# Patient Record
Sex: Female | Born: 1990 | Race: White | Hispanic: No | Smoking: Never smoker
Health system: Southern US, Community
[De-identification: ages and names within clinical notes are randomized; demographics above are authoritative.]

## PROBLEM LIST (undated history)

## (undated) DIAGNOSIS — F32A Depression, unspecified: Secondary | ICD-10-CM

## (undated) DIAGNOSIS — J45909 Unspecified asthma, uncomplicated: Secondary | ICD-10-CM

## (undated) DIAGNOSIS — F419 Anxiety disorder, unspecified: Secondary | ICD-10-CM

## (undated) DIAGNOSIS — K589 Irritable bowel syndrome without diarrhea: Secondary | ICD-10-CM

## (undated) HISTORY — DX: Depression, unspecified: F32.A

## (undated) HISTORY — PX: NO PAST SURGERIES: SHX2092

## (undated) HISTORY — DX: Irritable bowel syndrome, unspecified: K58.9

## (undated) HISTORY — DX: Unspecified asthma, uncomplicated: J45.909

## (undated) HISTORY — DX: Anxiety disorder, unspecified: F41.9

---

## 2019-06-19 ENCOUNTER — Other Ambulatory Visit: Payer: Self-pay | Admitting: Gastroenterology

## 2019-06-19 DIAGNOSIS — R1084 Generalized abdominal pain: Secondary | ICD-10-CM

## 2019-06-26 ENCOUNTER — Ambulatory Visit: Admission: RE | Admit: 2019-06-26 | Payer: 59 | Source: Ambulatory Visit

## 2019-07-18 ENCOUNTER — Other Ambulatory Visit: Payer: Self-pay | Admitting: Gastroenterology

## 2019-07-18 DIAGNOSIS — R110 Nausea: Secondary | ICD-10-CM

## 2019-07-23 ENCOUNTER — Ambulatory Visit: Payer: 59

## 2019-07-24 ENCOUNTER — Ambulatory Visit
Admission: RE | Admit: 2019-07-24 | Discharge: 2019-07-24 | Disposition: A | Discharge status: Home / Self Care | Payer: 59 | Source: Ambulatory Visit | Attending: Gastroenterology | Admitting: Gastroenterology

## 2019-07-24 ENCOUNTER — Other Ambulatory Visit: Payer: Self-pay

## 2019-07-24 DIAGNOSIS — R110 Nausea: Secondary | ICD-10-CM | POA: Diagnosis present

## 2020-06-18 ENCOUNTER — Ambulatory Visit
Admission: EM | Admit: 2020-06-18 | Discharge: 2020-06-18 | Disposition: A | Discharge status: Home / Self Care | Payer: Managed Care, Other (non HMO) | Attending: Family Medicine | Admitting: Family Medicine

## 2020-06-18 ENCOUNTER — Other Ambulatory Visit: Payer: Self-pay

## 2020-06-18 ENCOUNTER — Encounter: Payer: Self-pay | Admitting: Emergency Medicine

## 2020-06-18 DIAGNOSIS — L0291 Cutaneous abscess, unspecified: Secondary | ICD-10-CM

## 2020-06-18 MED ORDER — DOXYCYCLINE HYCLATE 100 MG PO CAPS: 100.0000 mg | ORAL_CAPSULE | Freq: Two times a day (BID) | ORAL | 0 refills | Status: DC

## 2020-06-18 MED ORDER — KETOROLAC TROMETHAMINE 10 MG PO TABS: 10.0000 mg | ORAL_TABLET | Freq: Four times a day (QID) | ORAL | 0 refills | Status: DC | PRN

## 2020-06-18 NOTE — ED Provider Notes (Addendum)
[facility]    CSN: [id number] Arrival date & time: 06/18/20  0932  History   Chief Complaint Chief Complaint  Patient presents with  . Abscess    On the tailbone   HPI  30 year old female presents with the above complaint.  Symptoms started 2 saturdays ago.  Patient reports a "cyst" at the level of her sacrum (at the tip of the gluteal cleft).  It is very painful.  It is tender.  Pain 8/10 in severity.  She has been applying warm compresses with some minimal relief.  She has been taking ibuprofen as well with little improvement.  She states that she has had this twice previously and resolved on its own after supportive therapy at home.  No fever.  No other complaints at this time.   Home Medications    Prior to Admission medications   Medication Sig Start Date End Date Taking? Authorizing Provider  doxycycline (VIBRAMYCIN) 100 MG capsule Take 1 capsule (100 mg total) by mouth 2 (two) times daily. 06/18/20  Yes Parveen Freehling G, DO  ketorolac (TORADOL) 10 MG tablet Take 1 tablet (10 mg total) by mouth every 6 (six) hours as needed for moderate pain or severe pain. 06/18/20  Yes Leor Whyte G, DO  norethindrone-ethinyl estradiol (NORTREL 0.5/35, 28,) 0.5-35 MG-MCG tablet Take by mouth. 05/09/19  Yes [provider]  pantoprazole (PROTONIX) 40 MG tablet Take 1 tablet by mouth daily. 05/22/20  Yes [provider]   Social History Social History   Tobacco Use  . Smoking status: Never Smoker  . Smokeless tobacco: Never Used  Substance Use Topics  . Alcohol use: Never  . Drug use: Never   Allergies   Patient has no known allergies.   Review of Systems Review of Systems  Constitutional: Negative.   Skin:       Abscess.    Physical Exam Triage Vital Signs ED Triage Vitals  Enc Vitals Group     BP 06/18/20 1056 112/79     Pulse Rate 06/18/20 1056 93     Resp 06/18/20 1056 17     Temp 06/18/20 1056 98.3 F (36.8 C)     Temp Source 06/18/20 1056  Oral     SpO2 06/18/20 1056 95 %     Weight --      Height --      Head Circumference --      Peak Flow --      Pain Score 06/18/20 1053 8     Pain Loc --      Pain Edu? --      Excl. in GC? --    Updated Vital Signs BP 112/79 (BP Location: Left Arm)   Pulse 93   Temp 98.3 F (36.8 C) (Oral)   Resp 17   LMP 05/12/2020 (Approximate)   SpO2 95%   Visual Acuity Right Eye Distance:   Left Eye Distance:   Bilateral Distance:    Right Eye Near:   Left Eye Near:    Bilateral Near:     Physical Exam Vitals and nursing note reviewed.  Constitutional:      General: She is not in acute distress.    Appearance: Normal appearance. She is not ill-appearing.  HENT:     Head: Normocephalic and atraumatic.  Eyes:     General:        Right eye: No discharge.        Left eye: No discharge.  Conjunctiva/sclera: Conjunctivae normal.  Pulmonary:     Effort: Pulmonary effort is normal. No respiratory distress.  Skin:         Comments: Fluctuant abscess at the labelled location. Surrounding warmth and erythema.  Tender to palpation.  Neurological:     Mental Status: She is alert.  Psychiatric:        Mood and Affect: Mood normal.        Behavior: Behavior normal.    UC Treatments / Results  Labs (all labs ordered are listed, but only abnormal results are displayed) Labs Reviewed - No data to display  EKG   Radiology No results found.  Procedures Incision and Drainage  Date/Time: 06/18/2020 12:43 PM Performed by: Tommie Sams, DO Authorized by: Tommie Sams, DO   Consent:    Consent obtained:  Verbal   Consent given by:  Patient   Risks discussed:  Pain   Alternatives discussed:  Alternative treatment Location:    Type:  Abscess   Location:  Anogenital   Anogenital location:  Gluteal cleft Pre-procedure details:    Skin preparation:  Povidone-iodine Anesthesia:    Anesthesia method:  Local infiltration   Local anesthetic:  Lidocaine 1% WITH  epi Procedure type:    Complexity:  Simple Procedure details:    Incision types:  Stab incision   Drainage:  Purulent   Drainage amount:  Copious   Wound treatment:  Wound left open   Packing materials:  1/4 in iodoform gauze Post-procedure details:    Procedure completion:  Tolerated well, no immediate complications   (including critical care time)  Medications Ordered in UC Medications - No data to display  Initial Impression / Assessment and Plan / UC Course  I have reviewed the triage vital signs and the nursing notes.  Pertinent labs & imaging results that were available during my care of the patient were reviewed by me and considered in my medical decision making (see chart for details).    30 year old female presents with abscess. I & D performed today. See above. Placing on Doxycyline. Packing to be removed in 48 hours (either at home or by [place] here). Toradol for pain.  Final Clinical Impressions(s) / UC Diagnoses   Final diagnoses:  Abscess     Discharge Instructions     Remove packing in 48 hours (or return here for [place] to remove).  Antibiotic as prescribed.  Take care  Dr. Adriana Simas    ED Prescriptions    Medication Sig Dispense Auth. Provider   ketorolac (TORADOL) 10 MG tablet Take 1 tablet (10 mg total) by mouth every 6 (six) hours as needed for moderate pain or severe pain. 20 tablet Jaylenne Hamelin G, DO   doxycycline (VIBRAMYCIN) 100 MG capsule Take 1 capsule (100 mg total) by mouth 2 (two) times daily. 14 capsule Everlene Other G, DO     PDMP not reviewed this encounter.    Tommie Sams, [place] 06/18/20 1244

## 2020-06-18 NOTE — ED Triage Notes (Signed)
Pt states that she has a cyst in her tail that appeared two weeks ago. Pt states that she noticed that the cyst started draining blood and clear/yellow fluid last night. Pt states that she has been taking Ibuprofen to control the pain

## 2020-06-18 NOTE — Discharge Instructions (Signed)
Remove packing in 48 hours (or return here for [place] to remove).  Antibiotic as prescribed.  Take care  Dr. Adriana Simas

## 2020-06-19 ENCOUNTER — Ambulatory Visit: Payer: Self-pay

## 2020-06-20 ENCOUNTER — Other Ambulatory Visit: Payer: Self-pay

## 2020-06-20 ENCOUNTER — Ambulatory Visit
Admission: RE | Admit: 2020-06-20 | Discharge: 2020-06-20 | Disposition: A | Discharge status: Home / Self Care | Payer: Managed Care, Other (non HMO) | Source: Ambulatory Visit | Attending: Family Medicine | Admitting: Family Medicine

## 2020-06-20 VITALS — BP 111/79 | HR 83 | Temp 98.9°F | Resp 18 | Ht 67.0 in | Wt 180.0 lb

## 2020-06-20 DIAGNOSIS — Z5189 Encounter for other specified aftercare: Secondary | ICD-10-CM

## 2020-06-20 NOTE — ED Provider Notes (Signed)
[facility]    CSN: [id number] Arrival date & time: 06/20/20  1052      History   Chief Complaint Chief Complaint  Patient presents with  . Wound Check   HPI  30 year old female presents for wound check/packing removal.  Doing well. Wound still draining some. Much improved. No fever. Compliant with antibiotic.  Home Medications    Prior to Admission medications   Medication Sig Start Date End Date Taking? Authorizing Provider  doxycycline (VIBRAMYCIN) 100 MG capsule Take 1 capsule (100 mg total) by mouth 2 (two) times daily. 06/18/20  Yes Zooey Schreurs G, DO  ketorolac (TORADOL) 10 MG tablet Take 1 tablet (10 mg total) by mouth every 6 (six) hours as needed for moderate pain or severe pain. 06/18/20  Yes Page Pucciarelli G, DO  norethindrone-ethinyl estradiol (NORTREL 0.5/35, 28,) 0.5-35 MG-MCG tablet Take by mouth. 05/09/19  Yes [provider]  pantoprazole (PROTONIX) 40 MG tablet Take 1 tablet by mouth daily. 05/22/20  Yes [provider]   Social History Social History   Tobacco Use  . Smoking status: Never Smoker  . Smokeless tobacco: Never Used  Substance Use Topics  . Alcohol use: Never  . Drug use: Never     Allergies   Patient has no known allergies.   Review of Systems Review of Systems  Constitutional: Negative for fever.  Skin: Positive for wound.   Physical Exam Triage Vital Signs ED Triage Vitals  Enc Vitals Group     BP 06/20/20 1119 111/79     Pulse Rate 06/20/20 1119 83     Resp 06/20/20 1119 18     Temp 06/20/20 1119 98.9 F (37.2 C)     Temp Source 06/20/20 1119 Oral     SpO2 06/20/20 1119 97 %     Weight 06/20/20 1117 180 lb (81.6 kg)     Height 06/20/20 1117 5\' 7"  (1.702 m)     Head Circumference --      Peak Flow --      Pain Score 06/20/20 1117 0     Pain Loc --      Pain Edu? --      Excl. in GC? --    Updated Vital Signs BP 111/79 (BP Location: Right Arm)   Pulse 83   Temp 98.9 F (37.2 C) (Oral)    Resp 18   Ht 5\' 7"  (1.702 m)   Wt 81.6 kg   LMP 05/13/2020   SpO2 97%   BMI 28.19 kg/m   Visual Acuity Right Eye Distance:   Left Eye Distance:   Bilateral Distance:    Right Eye Near:   Left Eye Near:    Bilateral Near:     Physical Exam Vitals and nursing note reviewed.  Constitutional:      General: She is not in acute distress.    Appearance: Normal appearance. She is not ill-appearing.  Skin:    Comments: Packing removed.  Minimal purulent drainage.  Erythema and induration improved.  Neurological:     Mental Status: She is alert.    UC Treatments / Results  Labs (all labs ordered are listed, but only abnormal results are displayed) Labs Reviewed - No data to display  EKG   Radiology No results found.  Procedures Procedures (including critical care time)  Medications Ordered in UC Medications - No data to display  Initial Impression / Assessment and Plan / UC Course  I have reviewed the triage  vital signs and the nursing notes.  Pertinent labs & imaging results that were available during my care of the patient were reviewed by me and considered in my medical decision making (see chart for details).    30 year old female presents for recheck and packing removal.  Packing removed today.  Wound dressed. Doing well at this time. Finish antibiotic.   Final Clinical Impressions(s) / UC Diagnoses   Final diagnoses:  Wound check, abscess   Discharge Instructions   None    ED Prescriptions    None     PDMP not reviewed this encounter.   Tommie Sams, DO 06/20/20 1200

## 2020-06-20 NOTE — ED Triage Notes (Signed)
Patient here for follow up to her abscess that was drained 2 days ago.

## 2020-11-18 ENCOUNTER — Ambulatory Visit: Payer: Managed Care, Other (non HMO) | Admitting: Family Medicine

## 2020-11-24 ENCOUNTER — Ambulatory Visit: Payer: Managed Care, Other (non HMO) | Admitting: Family Medicine

## 2020-11-24 ENCOUNTER — Other Ambulatory Visit: Payer: Self-pay

## 2020-11-24 ENCOUNTER — Encounter: Payer: Self-pay | Admitting: Family Medicine

## 2020-11-24 VITALS — BP 120/80 | HR 68 | Ht 67.0 in | Wt 193.0 lb

## 2020-11-24 DIAGNOSIS — Z7689 Persons encountering health services in other specified circumstances: Secondary | ICD-10-CM | POA: Diagnosis not present

## 2020-11-24 DIAGNOSIS — K2100 Gastro-esophageal reflux disease with esophagitis, without bleeding: Secondary | ICD-10-CM

## 2020-11-24 NOTE — Patient Instructions (Signed)
Managing Anxiety, Adult After being diagnosed with an anxiety disorder, you may be relieved to know why you have felt or behaved a certain way. You may also feel overwhelmed about the treatment ahead and what it will mean for your life. With care and support, youcan manage this condition and recover from it. How to manage lifestyle changes Managing stress and anxiety  Stress is your body's reaction to life changes and events, both good and bad. Most stress will last just a few hours, but stress can be ongoing and can lead to more than just stress. Although stress can play a major role in anxiety, it is not the same as anxiety. Stress is usually caused by something external, such as a deadline, test, or competition. Stress normally passes after thetriggering event has ended.  Anxiety is caused by something internal, such as imagining a terrible outcome or worrying that something will go wrong that will devastate you. Anxiety often does not go away even after the triggering event is over, and it can become long-term (chronic) worry. It is important to understand the differences between stress and anxiety and to manage your stress effectively so that it does not lead to ananxious response. Talk with your health care provider or a counselor to learn more about reducing anxiety and stress. He or she may suggest tension reduction techniques, such as: Music therapy. This can include creating or listening to music that you enjoy and that inspires you. Mindfulness-based meditation. This involves being aware of your normal breaths while not trying to control your breathing. It can be done while sitting or walking. Centering prayer. This involves focusing on a word, phrase, or sacred image that means something to you and brings you peace. Deep breathing. To do this, expand your stomach and inhale slowly through your nose. Hold your breath for 3-5 seconds. Then exhale slowly, letting your stomach muscles  relax. Self-talk. This involves identifying thought patterns that lead to anxiety reactions and changing those patterns. Muscle relaxation. This involves tensing muscles and then relaxing them. Choose a tension reduction technique that suits your lifestyle and personality. These techniques take time and practice. Set aside 5-15 minutes a day to do them. Therapists can offer counseling and training in these techniques. The training to help with anxiety may be covered by some insurance plans. Other things you can do to manage stress and anxiety include: Keeping a stress/anxiety diary. This can help you learn what triggers your reaction and then learn ways to manage your response. Thinking about how you react to certain situations. You may not be able to control everything, but you can control your response. Making time for activities that help you relax and not feeling guilty about spending your time in this way. Visual imagery and yoga can help you stay calm and relax.  Medicines Medicines can help ease symptoms. Medicines for anxiety include: Anti-anxiety drugs. Antidepressants. Medicines are often used as a primary treatment for anxiety disorder. Medicines will be prescribed by a health care provider. When used together, medicines, psychotherapy, and tension reduction techniques may be the most effectivetreatment. Relationships Relationships can play a big part in helping you recover. Try to spend more time connecting with trusted friends and family members. Consider going to couples counseling, taking family education classes, or going to familytherapy. Therapy can help you and others better understand your condition. How to recognize changes in your anxiety Everyone responds differently to treatment for anxiety. Recovery from anxiety happens when symptoms decrease and stop   interfering with your daily activities at home or work. This may mean that you will start to: Have better concentration and  focus. Worry will interfere less in your daily thinking. Sleep better. Be less irritable. Have more energy. Have improved memory. It is important to recognize when your condition is getting worse. Contact your health care provider if your symptoms interfere with home or work and you feellike your condition is not improving. Follow these instructions at home: Activity Exercise. Most adults should do the following: Exercise for at least 150 minutes each week. The exercise should increase your heart rate and make you sweat (moderate-intensity exercise). Strengthening exercises at least twice a week. Get the right amount and quality of sleep. Most adults need 7-9 hours of sleep each night. Lifestyle  Eat a healthy diet that includes plenty of vegetables, fruits, whole grains, low-fat dairy products, and lean protein. Do not eat a lot of foods that are high in solid fats, added sugars, or salt. Make choices that simplify your life. Do not use any products that contain nicotine or tobacco, such as cigarettes, e-cigarettes, and chewing tobacco. If you need help quitting, ask your health care provider. Avoid caffeine, alcohol, and certain over-the-counter cold medicines. These may make you feel worse. Ask your pharmacist which medicines to avoid.  General instructions Take over-the-counter and prescription medicines only as told by your health care provider. Keep all follow-up visits as told by your health care provider. This is important. Where to find support You can get help and support from these sources: Self-help groups. Online and [employer]. A trusted spiritual leader. Couples counseling. Family education classes. Family therapy. Where to find more information You may find that joining a support group helps you deal with your anxiety. The following sources can help you locate counselors or support groups near you: Mental Health America:  www.mentalhealthamerica.net Anxiety and Depression Association of [place] (ADAA): ProgramCam.de [employer] on Mental Illness (NAMI): www.nami.org Contact a health care provider if you: Have a hard time staying focused or finishing daily tasks. Spend many hours a day feeling worried about everyday life. Become exhausted by worry. Start to have headaches, feel tense, or have nausea. Urinate more than normal. Have diarrhea. Get help right away if you have: A racing heart and shortness of breath. Thoughts of hurting yourself or others. If you ever feel like you may hurt yourself or others, or have thoughts about taking your own life, get help right away. You can go to your nearest emergency department or call: Your local emergency services (911 in the [place] A suicide crisis helpline, such as the National Suicide Prevention Lifeline at 223-056-0827. This is open 24 hours a day. Summary Taking steps to learn and use tension reduction techniques can help calm you and help prevent triggering an anxiety reaction. When used together, medicines, psychotherapy, and tension reduction techniques may be the most effective treatment. Family, friends, and partners can play a big part in helping you recover from an anxiety disorder. This information is not intended to replace advice given to you by your health care provider. Make sure you discuss any questions you have with your healthcare provider. Document Revised: 10/24/2018 Document Reviewed: 10/24/2018 Elsevier Patient Education  2022 Elsevier Inc. http://NIMH.NIH.Gov">  Generalized Anxiety Disorder, Adult Generalized anxiety disorder (GAD) is a mental health condition. Unlike normal worries, anxiety related to GAD is not triggered by a specific event. These worries do not fade or get better with time. GAD interferes with relationships,work, and  school. GAD symptoms can vary from mild to severe. People with severe GAD can have intense waves of  anxiety with physical symptoms that are similar to panicattacks. What are the causes? The exact cause of GAD is not known, but the following are believed to have an impact: Differences in natural brain chemicals. Genes passed down from parents to children. Differences in the way threats are perceived. Development during childhood. Personality. What increases the risk? The following factors may make you more likely to develop this condition: Being female. Having a family history of anxiety disorders. Being very shy. Experiencing very stressful life events, such as the death of a loved one. Having a very stressful family environment. What are the signs or symptoms? People with GAD often worry excessively about many things in their lives, such as their health and family. Symptoms may also include: Mental and emotional symptoms: Worrying excessively about natural disasters. Fear of being late. Difficulty concentrating. Fears that others are judging your performance. Physical symptoms: Fatigue. Headaches, muscle tension, muscle twitches, trembling, or feeling shaky. Feeling like your heart is pounding or beating very fast. Feeling out of breath or like you cannot take a deep breath. Having trouble falling asleep or staying asleep, or experiencing restlessness. Sweating. Nausea, diarrhea, or irritable bowel syndrome (IBS). Behavioral symptoms: Experiencing erratic moods or irritability. Avoidance of new situations. Avoidance of people. Extreme difficulty making decisions. How is this diagnosed? This condition is diagnosed based on your symptoms and medical history. You will also have a physical exam. Your health care provider may perform tests torule out other possible causes of your symptoms. To be diagnosed with GAD, a person must have anxiety that: Is out of his or her control. Affects several different aspects of his or her life, such as work and relationships. Causes distress  that makes him or her unable to take part in normal activities. Includes at least three symptoms of GAD, such as restlessness, fatigue, trouble concentrating, irritability, muscle tension, or sleep problems. Before your health care provider can confirm a diagnosis of GAD, these symptoms must be present more days than they are not, and they must last for 6 months orlonger. How is this treated? This condition may be treated with: Medicine. Antidepressant medicine is usually prescribed for long-term daily control. Anti-anxiety medicines may be added in severe cases, especially when panic attacks occur. Talk therapy (psychotherapy). Certain types of talk therapy can be helpful in treating GAD by providing support, education, and guidance. Options include: Cognitive behavioral therapy (CBT). People learn coping skills and self-calming techniques to ease their physical symptoms. They learn to identify unrealistic thoughts and behaviors and to replace them with more appropriate thoughts and behaviors. Acceptance and commitment therapy (ACT). This treatment teaches people how to be mindful as a way to cope with unwanted thoughts and feelings. Biofeedback. This process trains you to manage your body's response (physiological response) through breathing techniques and relaxation methods. You will work with a therapist while machines are used to monitor your physical symptoms. Stress management techniques. These include yoga, meditation, and exercise. A mental health specialist can help determine which treatment is best for you. Some people see improvement with one type of therapy. However, other peoplerequire a combination of therapies. Follow these instructions at home: Lifestyle Maintain a consistent routine and schedule. Anticipate stressful situations. Create a plan, and allow extra time to work with your plan. Practice stress management or self-calming techniques that you have learned from your therapist  or your health care  provider. General instructions Take over-the-counter and prescription medicines only as told by your health care provider. Understand that you are likely to have setbacks. Accept this and be kind to yourself as you persist to take better care of yourself. Recognize and accept your accomplishments, even if you judge them as small. Keep all follow-up visits as told by your health care provider. This is important. Contact a health care provider if: Your symptoms do not get better. Your symptoms get worse. You have signs of depression, such as: A persistently sad or irritable mood. Loss of enjoyment in activities that used to bring you joy. Change in weight or eating. Changes in sleeping habits. Avoiding friends or family members. Loss of energy for normal tasks. Feelings of guilt or worthlessness. Get help right away if: You have serious thoughts about hurting yourself or others. If you ever feel like you may hurt yourself or others, or have thoughts about taking your own life, get help right away. Go to your nearest emergency department or: Call your local emergency services (911 in the [place] Call a suicide crisis helpline, such as the National Suicide Prevention Lifeline at (507)254-1103. This is open 24 hours a day in the [place] Text the Crisis Text Line at 415-410-4408 (in the [place] Summary Generalized anxiety disorder (GAD) is a mental health condition that involves worry that is not triggered by a specific event. People with GAD often worry excessively about many things in their lives, such as their health and family. GAD may cause symptoms such as restlessness, trouble concentrating, sleep problems, frequent sweating, nausea, diarrhea, headaches, and trembling or muscle twitching. A mental health specialist can help determine which treatment is best for you. Some people see improvement with one type of therapy. However, other people require a combination of  therapies. This information is not intended to replace advice given to you by your health care provider. Make sure you discuss any questions you have with your healthcare provider. Document Revised: 03/14/2019 Document Reviewed: 03/14/2019 Elsevier Patient Education  2022 [employer].

## 2020-11-24 NOTE — Progress Notes (Signed)
Date:  11/24/2020   Name:  Ann Simmons   DOB:  July 08, 1990   MRN:  [id number]   Chief Complaint: Establish Care  Patient is a 30 year old female who presents for a comprehensive physical exam. The patient reports the following problems: none. Health maintenance has been reviewed gyn referrral     No results found for: CREATININE, BUN, NA, K, CL, CO2 No results found for: CHOL, HDL, LDLCALC, LDLDIRECT, TRIG, CHOLHDL No results found for: TSH No results found for: HGBA1C No results found for: WBC, HGB, HCT, MCV, PLT No results found for: ALT, AST, GGT, ALKPHOS, BILITOT   Review of Systems  Constitutional:  Negative for chills and fever.  HENT:  Negative for drooling and sore throat.   Respiratory:  Negative for cough.   Cardiovascular:  Negative for chest pain, palpitations and leg swelling.  Gastrointestinal:  Negative for abdominal pain, blood in stool, constipation, diarrhea and nausea.  Endocrine: Negative for polydipsia.  Genitourinary:  Negative for dysuria, frequency, hematuria and urgency.  Musculoskeletal:  Negative for back pain, myalgias and neck pain.  Skin:  Negative for rash.  Allergic/Immunologic: Negative for environmental allergies.  Neurological:  Negative for dizziness and headaches.  Hematological:  Does not bruise/bleed easily.  Psychiatric/Behavioral:  Negative for agitation, decreased concentration and suicidal ideas. The patient is not nervous/anxious.   All other systems reviewed and are negative.  There are no problems to display for this patient.   No Known Allergies  History reviewed. No pertinent surgical history.  Social History   Tobacco Use   Smoking status: Never   Smokeless tobacco: Never  Vaping Use   Vaping Use: Never used  Substance Use Topics   Alcohol use: Never   Drug use: Never     Medication list has been reviewed and updated.  Current Meds  Medication Sig   norethindrone-ethinyl estradiol (NORTREL 0.5/35, 28,) 0.5-35  MG-MCG tablet Take by mouth.   PROAIR HFA 108 (90 Base) MCG/ACT inhaler Inhale 2 puffs into the lungs every 4 (four) hours as needed.    PHQ 2/9 Scores 11/24/2020  PHQ - 2 Score 0  PHQ- 9 Score 11    GAD 7 : Generalized Anxiety Score 11/24/2020  Nervous, Anxious, on Edge 1  Control/stop worrying 1  Worry too much - different things 1  Trouble relaxing 1  Restless 1  Easily annoyed or irritable 1  Afraid - awful might happen 3  Total GAD 7 Score 9  Anxiety Difficulty Somewhat difficult    BP Readings from Last 3 Encounters:  11/24/20 120/80  06/20/20 111/79  06/18/20 112/79    Physical Exam Vitals and nursing note reviewed.  Constitutional:      General: She is not in acute distress.    Appearance: She is not diaphoretic.  HENT:     Head: Normocephalic and atraumatic.     Right Ear: External ear normal.     Left Ear: External ear normal.     Nose: Nose normal.  Eyes:     General:        Right eye: No discharge.        Left eye: No discharge.     Conjunctiva/sclera: Conjunctivae normal.     Pupils: Pupils are equal, round, and reactive to light.  Neck:     Thyroid: No thyromegaly.     Vascular: No JVD.  Cardiovascular:     Rate and Rhythm: Normal rate and regular rhythm.  Heart sounds: Normal heart sounds. No murmur heard.   No friction rub. No gallop.  Pulmonary:     Effort: Pulmonary effort is normal.     Breath sounds: Normal breath sounds.  Abdominal:     General: Bowel sounds are normal.     Palpations: Abdomen is soft. There is no mass.     Tenderness: There is no abdominal tenderness. There is no guarding.  Musculoskeletal:        General: Normal range of motion.     Cervical back: Normal range of motion and neck supple.  Lymphadenopathy:     Cervical: No cervical adenopathy.  Skin:    General: Skin is warm and dry.  Neurological:     Mental Status: She is alert.     Deep Tendon Reflexes: Reflexes are normal and symmetric.    Wt Readings from  Last 3 Encounters:  11/24/20 193 lb (87.5 kg)  06/20/20 180 lb (81.6 kg)    BP 120/80   Pulse 68   Ht 5\' 7"  (1.702 m)   Wt 193 lb (87.5 kg)   LMP 11/24/2020 (Exact Date)   BMI 30.23 kg/m   Assessment and Plan: Chronic.  Controlled.  Stable.  1. Establishing care with new doctor, encounter for Patient establishing care with new physician today.  There are no specific concerns except for some anxiety for which patient has been given information on self relaxation techniques.  Referral has been made for gynecology for maintenance and refills of her birth control medication. - Ambulatory referral to Gynecology  2. Gastroesophageal reflux disease with esophagitis without hemorrhage Review of patient's visit at [facility] with [facility].  I have suggested to the patient rather than taking Elavil that we can control this with her anxiety management.  We also have suggested that we we can use over-the-counter famotidine to reduce acid and not have to continue on pantoprazole at this time but will consider in the future if necessary.

## 2020-12-11 ENCOUNTER — Other Ambulatory Visit (HOSPITAL_COMMUNITY)
Admission: RE | Admit: 2020-12-11 | Discharge: 2020-12-11 | Disposition: A | Discharge status: Home / Self Care | Payer: Managed Care, Other (non HMO) | Source: Ambulatory Visit | Attending: Obstetrics and Gynecology | Admitting: Obstetrics and Gynecology

## 2020-12-11 ENCOUNTER — Encounter: Payer: Self-pay | Admitting: Obstetrics and Gynecology

## 2020-12-11 ENCOUNTER — Other Ambulatory Visit: Payer: Self-pay

## 2020-12-11 ENCOUNTER — Ambulatory Visit: Payer: Managed Care, Other (non HMO) | Admitting: Obstetrics and Gynecology

## 2020-12-11 VITALS — BP 112/72 | HR 80 | Resp 18 | Ht 67.0 in | Wt 192.8 lb

## 2020-12-11 DIAGNOSIS — Z113 Encounter for screening for infections with a predominantly sexual mode of transmission: Secondary | ICD-10-CM | POA: Insufficient documentation

## 2020-12-11 DIAGNOSIS — Z01419 Encounter for gynecological examination (general) (routine) without abnormal findings: Secondary | ICD-10-CM | POA: Diagnosis not present

## 2020-12-11 DIAGNOSIS — Z30011 Encounter for initial prescription of contraceptive pills: Secondary | ICD-10-CM | POA: Diagnosis not present

## 2020-12-11 DIAGNOSIS — Z124 Encounter for screening for malignant neoplasm of cervix: Secondary | ICD-10-CM | POA: Diagnosis not present

## 2020-12-11 MED ORDER — ORTHO-NOVUM 1/35 (28) 1-35 MG-MCG PO TABS: 1.0000 | ORAL_TABLET | Freq: Every day | ORAL | 4 refills | Status: DC

## 2020-12-11 NOTE — Progress Notes (Signed)
Gynecology Annual Exam  PCP: Duanne Limerick, MD  Chief Complaint  Patient presents with   Gynecologic Exam    History of Present Illness:  Ms. Ann Simmons is a 30 y.o. G0P0000 who LMP was Patient's last menstrual period was 12/02/2020 (exact date)., presents today for her annual examination.  Her menses are regular every 28-30 days, lasting 4 day(s).  Dysmenorrhea ranges from none to horrible. She does not have intermenstrual bleeding.  She is sexually active, uses Nortrel.  Last Pap: a couple of years ago.  Results were: no abnormalities /neg HPV DNA not done.  Hx of STDs: none  There is no FH of breast cancer. There is no FH of ovarian cancer. The patient does do self-breast exams.  Tobacco use: The patient denies current or previous tobacco use. Alcohol use: none Exercise: 2 miles a day of walking  The patient wears seatbelts: yes.   The patient reports that domestic violence in her life is absent.   Past Medical History:  Diagnosis Date   Anxiety    Asthma    Depression    Irritable bowel syndrome (IBS)     Past Surgical History:  Procedure Laterality Date   NO PAST SURGERIES N/A     Prior to Admission medications   Medication Sig Start Date End Date Taking? Authorizing Provider  norethindrone-ethinyl estradiol (NORTREL 0.5/35, 28,) 0.5-35 MG-MCG tablet Take by mouth. 05/09/19  Yes [provider]  PROAIR HFA 108 (90 Base) MCG/ACT inhaler Inhale 2 puffs into the lungs every 4 (four) hours as needed. 08/19/20  Yes [provider]   Allergies: No Known Allergies  Obstetric History: G0P0000   Social History   Socioeconomic History   Marital status: Media planner    Spouse name: Not on file   Number of children: Not on file   Years of education: Not on file   Highest education level: Not on file  Occupational History   Not on file  Tobacco Use   Smoking status: Never   Smokeless tobacco: Never  Vaping Use   Vaping Use: Never used   Substance and Sexual Activity   Alcohol use: Never   Drug use: Never   Sexual activity: Yes    Birth control/protection: Pill  Other Topics Concern   Not on file  Social History Narrative   Not on file   Social Determinants of Health   Financial Resource Strain: Not on file  Food Insecurity: Not on file  Transportation Needs: Not on file  Physical Activity: Not on file  Stress: Not on file  Social Connections: Not on file  Intimate Partner Violence: Not on file    Family History  Problem Relation Age of Onset   Hypertension Paternal Grandfather    Hypertension Paternal Grandmother    Cancer Maternal Grandfather        skin   Cancer Mother        skin   Cancer Brother        skin   Ovarian cancer Neg Hx    Breast cancer Neg Hx     Review of Systems  Constitutional: Negative.   HENT: Negative.    Eyes: Negative.   Respiratory: Negative.    Cardiovascular: Negative.   Gastrointestinal: Negative.   Genitourinary: Negative.   Musculoskeletal: Negative.   Skin: Negative.   Neurological: Negative.   Psychiatric/Behavioral: Negative.      Physical Exam BP 112/72   Pulse 80   Resp 18   Ht  5\' 7"  (1.702 m)   Wt 192 lb 12.8 oz (87.5 kg)   LMP 12/02/2020 (Exact Date)   BMI 30.20 kg/m    Physical Exam Constitutional:      General: She is not in acute distress.    Appearance: Normal appearance. She is well-developed.  Genitourinary:     Vulva and bladder normal.     Right Labia: No rash, tenderness, lesions, skin changes or Bartholin's cyst.    Left Labia: No tenderness, lesions, skin changes, Bartholin's cyst or rash.    No inguinal adenopathy present in the right or left side.    Pelvic Tanner Score: 5/5.    No vaginal discharge, erythema, tenderness or bleeding.      Right Adnexa: not tender, not full and no mass present.    Left Adnexa: not tender, not full and no mass present.    No cervical motion tenderness, discharge, lesion or polyp.     Uterus is  not enlarged or tender.     No uterine mass detected.    Pelvic exam was performed with patient in the lithotomy position.  Breasts:    Right: No inverted nipple, mass, nipple discharge, skin change or tenderness.     Left: No inverted nipple, mass, nipple discharge, skin change or tenderness.  HENT:     Head: Normocephalic and atraumatic.  Eyes:     General: No scleral icterus.    Conjunctiva/sclera: Conjunctivae normal.  Neck:     Thyroid: No thyromegaly.  Cardiovascular:     Rate and Rhythm: Normal rate and regular rhythm.     Heart sounds: No murmur heard.   No friction rub. No gallop.  Pulmonary:     Effort: Pulmonary effort is normal. No respiratory distress.     Breath sounds: Normal breath sounds. No wheezing or rales.  Abdominal:     General: Bowel sounds are normal. There is no distension.     Palpations: Abdomen is soft. There is no mass.     Tenderness: There is no abdominal tenderness. There is no guarding or rebound.     Hernia: There is no hernia in the left inguinal area or right inguinal area.  Musculoskeletal:        General: No swelling or tenderness. Normal range of motion.     Cervical back: Normal range of motion and neck supple.  Lymphadenopathy:     Cervical: No cervical adenopathy.     Lower Body: No right inguinal adenopathy. No left inguinal adenopathy.  Neurological:     General: No focal deficit present.     Mental Status: She is alert and oriented to person, place, and time.     Cranial Nerves: No cranial nerve deficit.  Skin:    General: Skin is warm and dry.     Findings: No erythema or rash.  Psychiatric:        Mood and Affect: Mood normal.        Behavior: Behavior normal.        Judgment: Judgment normal.    Female chaperone present for pelvic and breast  portions of the physical exam   Assessment: 30 y.o. G0P0000 female here for routine annual gynecologic examination  Plan: Problem List Items Addressed This Visit   None Visit  Diagnoses     Women's annual routine gynecological examination    -  Primary   Pap smear for cervical cancer screening       Screen for STD (sexually transmitted disease)  Encounter for initial prescription of contraceptive pills       Relevant Medications   norethindrone-ethinyl estradiol 1/35 (ORTHO-NOVUM 1/35, 28,) tablet      Screening: -- Blood pressure screen normal -- Weight screening: overweight: continue to monitor -- Depression screening negative (PHQ-9) -- Nutrition: normal -- cholesterol screening: not due for screening -- osteoporosis screening: not due -- tobacco screening: not using -- alcohol screening: AUDIT questionnaire indicates low-risk usage. -- family history of breast cancer screening: done. not at high risk. -- no evidence of domestic violence or intimate partner violence. -- STD screening: gonorrhea/chlamydia NAAT collected -- pap smear collected per ASCCP guidelines -- HPV vaccination series:  discussed, will consider  Thomasene Mohair, MD 12/11/2020 9:05 AM

## 2020-12-18 ENCOUNTER — Encounter: Payer: Self-pay | Admitting: Obstetrics and Gynecology

## 2020-12-18 LAB — CYTOLOGY - PAP
Chlamydia: NEGATIVE
Comment: NEGATIVE
Comment: NEGATIVE
Comment: NEGATIVE
Comment: NORMAL
Diagnosis: NEGATIVE
High risk HPV: NEGATIVE
Neisseria Gonorrhea: NEGATIVE
Trichomonas: NEGATIVE

## 2021-03-31 ENCOUNTER — Encounter: Payer: Self-pay | Admitting: Family Medicine

## 2021-03-31 ENCOUNTER — Ambulatory Visit: Payer: Managed Care, Other (non HMO) | Admitting: Family Medicine

## 2021-03-31 ENCOUNTER — Other Ambulatory Visit: Payer: Self-pay

## 2021-03-31 VITALS — BP 106/82 | HR 93 | Ht 67.0 in | Wt 191.0 lb

## 2021-03-31 DIAGNOSIS — L0501 Pilonidal cyst with abscess: Secondary | ICD-10-CM

## 2021-03-31 MED ORDER — AMOXICILLIN-POT CLAVULANATE 875-125 MG PO TABS: 1.0000 | ORAL_TABLET | Freq: Two times a day (BID) | ORAL | 0 refills | Status: DC

## 2021-03-31 NOTE — Progress Notes (Signed)
    Date:  03/31/2021   Name:  Ann Simmons   DOB:  08-27-1990   MRN:  [id number]   Chief Complaint: Cyst (Noticed on Friday)  Patient is a 30 year old female who presents for a pilinidal cyst/abcess exam. The patient reports the following problems: as noted. Health maintenance has been reviewed up to date.     No results found for: CREATININE, BUN, NA, K, CL, CO2 No results found for: CHOL, HDL, LDLCALC, LDLDIRECT, TRIG, CHOLHDL No results found for: TSH No results found for: HGBA1C No results found for: WBC, HGB, HCT, MCV, PLT No results found for: ALT, AST, GGT, ALKPHOS, BILITOT   Review of Systems  There are no problems to display for this patient.   No Known Allergies  Past Surgical History:  Procedure Laterality Date   NO PAST SURGERIES N/A     Social History   Tobacco Use   Smoking status: Never   Smokeless tobacco: Never  Vaping Use   Vaping Use: Never used  Substance Use Topics   Alcohol use: Never   Drug use: Never     Medication list has been reviewed and updated.  Current Meds  Medication Sig   norethindrone-ethinyl estradiol 1/35 (ORTHO-NOVUM 1/35, 28,) tablet Take 1 tablet by mouth daily.   PROAIR HFA 108 (90 Base) MCG/ACT inhaler Inhale 2 puffs into the lungs every 4 (four) hours as needed.    PHQ 2/9 Scores 03/31/2021 11/24/2020  PHQ - 2 Score 0 0  PHQ- 9 Score 0 11    GAD 7 : Generalized Anxiety Score 03/31/2021 11/24/2020  Nervous, Anxious, on Edge 2 1  Control/stop worrying 1 1  Worry too much - different things 1 1  Trouble relaxing 1 1  Restless 1 1  Easily annoyed or irritable 0 1  Afraid - awful might happen 1 3  Total GAD 7 Score 7 9  Anxiety Difficulty Not difficult at all Somewhat difficult    BP Readings from Last 3 Encounters:  03/31/21 106/82  12/11/20 112/72  11/24/20 120/80    Physical Exam  Wt Readings from Last 3 Encounters:  03/31/21 191 lb (86.6 kg)  12/11/20 192 lb 12.8 oz (87.5 kg)  11/24/20 193 lb (87.5  kg)    BP 106/82   Pulse 93   Ht 5\' 7"  (1.702 m)   Wt 191 lb (86.6 kg)   LMP 03/20/2021 (Approximate)   BMI 29.91 kg/m   Assessment and Plan:  1. Pilonidal abscess Chronic.  Episodic.  Currently draining as an abscess which occurs every 1 to 2 years.  Patient has not had definitive surgery for pilonidal cyst cyst excision.  We will initiate Augmentin 875 mg twice a day and referral to general surgery for drainage of the abscess and discussion of further surgical intervention. - Ambulatory referral to General Surgery

## 2021-03-31 NOTE — Patient Instructions (Signed)
Pilonidal Cyst °A pilonidal cyst is a fluid-filled sac that forms beneath the skin near the tailbone, at the top of the crease of the buttocks (pilonidal area). If the cyst is not large and not infected, it may not cause any problems. °If the cyst becomes irritated or infected, it may get larger and fill with pus. An infected cyst is called an abscess. A pilonidal abscess may cause pain and swelling, and it may need to be drained or removed. °What are the causes? °The cause of this condition is not always known. In some cases, a hair that grows into your skin (ingrown hair) may be the cause. °What increases the risk? °You are more likely to get a pilonidal cyst if you: °Are female. °Have lots of hair near the crease of the buttocks. °Are overweight. °Have a dimple near the crease of the buttocks. °Wear tight clothing. °Do not bathe or shower often. °Sit for long periods of time. °What are the signs or symptoms? °Signs and symptoms of a pilonidal cyst may include pain, swelling, redness, and warmth in the pilonidal area. Depending on how big the cyst is, you may be able to feel a lump near your tailbone. If your cyst becomes infected, symptoms may include: °Pus or fluid drainage. °Fever. °Pain, swelling, and redness getting worse. °The lump getting bigger. °How is this diagnosed? °This condition may be diagnosed based on: °Your symptoms and medical history. °A physical exam. °A blood test to check for infection. °Testing a pus sample, if applicable. °How is this treated? °If your cyst does not cause symptoms, you may not need any treatment. If your cyst bothers you or is infected, you may need a procedure to drain or remove the cyst. Depending on the size, location, and severity of your cyst, your health care provider may: °Make an incision in the cyst and drain it (incision and drainage). °Open and drain the cyst, and then stitch the wound so that it stays open while it heals (marsupialization). You will be given  instructions about how to care for your open wound while it heals. °Remove all or part of the cyst, and then close the wound (cyst removal). °You may need to take antibiotic medicines before your procedure. °Follow these instructions at home: °Medicines °Take over-the-counter and prescription medicines only as told by your health care provider. °If you were prescribed an antibiotic medicine, take it as told by your health care provider. Do not stop taking the antibiotic even if you start to feel better. °General instructions °Keep the area around your pilonidal cyst clean and dry. °If there is fluid or pus draining from your cyst: °Cover the area with a clean bandage (dressing) as needed. °Wash the area gently with soap and water. Pat the area dry with a clean towel. Do not rub the area because that may cause bleeding. °Remove hair from the area around the cyst only if your health care provider tells you to do this. °Do not wear tight pants or sit in one position for long periods at a time. °Keep all follow-up visits as told by your health care provider. This is important. °Contact a health care provider if you have: °New redness, swelling, or pain. °A fever. °Severe pain. °Summary °A pilonidal cyst is a fluid-filled sac that forms beneath the skin near the tailbone, at the top of the crease of the buttocks (pilonidal area). °If the cyst becomes irritated or infected, it may get larger and fill with pus. An infected   cyst is called an abscess. °The cause of this condition is not always known. In some cases, a hair that grows into your skin (ingrown hair) may be the cause. °If your cyst does not cause symptoms, you may not need any treatment. If your cyst bothers you or is infected, you may need a procedure to drain or remove the cyst. °This information is not intended to replace advice given to you by your health care provider. Make sure you discuss any questions you have with your health care provider. °Document  Revised: 04/03/2020 Document Reviewed: 04/03/2020 °Elsevier Patient Education © 2022 [employer] ° °

## 2021-04-01 ENCOUNTER — Ambulatory Visit: Payer: Managed Care, Other (non HMO) | Admitting: Physician Assistant

## 2021-04-01 ENCOUNTER — Encounter: Payer: Self-pay | Admitting: Physician Assistant

## 2021-04-01 VITALS — BP 114/70 | HR 75 | Temp 98.9°F | Ht 67.0 in | Wt 190.0 lb

## 2021-04-01 DIAGNOSIS — L0591 Pilonidal cyst without abscess: Secondary | ICD-10-CM

## 2021-04-01 MED ORDER — OXYCODONE HCL 5 MG PO TABS: 5.0000 mg | ORAL_TABLET | Freq: Four times a day (QID) | ORAL | 0 refills | Status: DC | PRN

## 2021-04-01 NOTE — Patient Instructions (Addendum)
You should do warm compresses several times a day. Finish all of your Augmentin.   We will send in some pain medication into your pharmacy. You may also take Ibuprofen 600-800mg  4 times a day.   We will have you return in 2 weeks to see one of our surgeons to talk about surgery.   If the area starts to worsen, you develop a fever, the area gets larger call us as you may need to have it drained at that time.

## 2021-04-01 NOTE — Progress Notes (Signed)
[facility] NEW PATIENT  Referring provider:  Duanne Limerick, MD [address] [place],  [place] [address]  HISTORY OF PRESENT ILLNESS (HPI):  30 y.o. female presents for evaluation of recurrent pilonidal cysts. Patient reports she has gotten a recurrent pilonidal cyst/abscess flare up almost yearly for the last 4 years. She ad needed these drained once in the past but typically resolve with Abx alone. She was seen by her PCP for this earlier in the week and started on Augmentin and referred to our office for consideration of formal excision. She denied any fevers or chills. The area is expectedly tender and exacerbated by sitting at work. No cough, congestion, SOB, CP, abdominal pain, nausea, or emesis. She does interestingly note a family history of similar in all her sisters, and her oldest sister did have these excised. No other previous surgeries. No other complaints.    PAST MEDICAL HISTORY (PMH):  Past Medical History:  Diagnosis Date   Anxiety    Asthma    Depression    Irritable bowel syndrome (IBS)      PAST SURGICAL HISTORY (PSH):  Past Surgical History:  Procedure Laterality Date   NO PAST SURGERIES N/A      MEDICATIONS:  Prior to Admission medications   Medication Sig Start Date End Date Taking? Authorizing Provider  amoxicillin-clavulanate (AUGMENTIN) 875-125 MG tablet Take 1 tablet by mouth 2 (two) times daily. 03/31/21   Duanne Limerick, MD  norethindrone-ethinyl estradiol 1/35 (ORTHO-NOVUM 1/35, 28,) tablet Take 1 tablet by mouth daily. 12/11/20   Conard Novak, MD  PROAIR HFA 108 [phone] Base) MCG/ACT inhaler Inhale 2 puffs into the lungs every 4 (four) hours as needed. 08/19/20   [provider]     ALLERGIES:  Allergies  Allergen Reactions   Casein Rash     SOCIAL HISTORY:  Social History   Socioeconomic History   Marital status: Media planner    Spouse name: Not on file   Number of children: Not on  file   Years of education: Not on file   Highest education level: Not on file  Occupational History   Not on file  Tobacco Use   Smoking status: Never   Smokeless tobacco: Never  Vaping Use   Vaping Use: Never used  Substance and Sexual Activity   Alcohol use: Never   Drug use: Never   Sexual activity: Yes    Birth control/protection: Pill  Other Topics Concern   Not on file  Social History Narrative   Not on file   Social Determinants of Health   Financial Resource Strain: Not on file  Food Insecurity: Not on file  Transportation Needs: Not on file  Physical Activity: Not on file  Stress: Not on file  Social Connections: Not on file  Intimate Partner Violence: Not on file    The patient currently resides (home / rehab facility / nursing home): Home The patient normally is (ambulatory / bedbound): Ambulatory  FAMILY HISTORY:  Family History  Problem Relation Age of Onset   Hypertension Paternal Grandfather    Hypertension Paternal Grandmother    Cancer Maternal Grandfather        skin   Cancer Mother        skin   Cancer Brother        skin   Ovarian cancer Neg Hx    Breast cancer Neg Hx     Otherwise negative/non-contributory.  REVIEW OF SYSTEMS:  Review  of Systems  Constitutional:  Negative for chills and fever.  HENT:  Negative for congestion and sore throat.   Respiratory:  Negative for cough and shortness of breath.   Cardiovascular:  Negative for chest pain and palpitations.  Gastrointestinal:  Negative for abdominal pain, nausea and vomiting.  Genitourinary:  Negative for dysuria and urgency.  Skin:        + Pilonidal Cyst  All other systems reviewed and are negative.   VITAL SIGNS:  @VSRANGES @     Height: 5\' 7"  (170.2 cm) Weight: 190 lb (86.2 kg) BMI (Calculated): 29.75   PHYSICAL EXAM:  Physical Exam Vitals and nursing note reviewed. Exam conducted with a chaperone present.  Constitutional:      General: She is not in acute distress.     Appearance: Normal appearance. She is obese. She is not ill-appearing.  HENT:     Head: Normocephalic and atraumatic.     Mouth/Throat:     Mouth: Mucous membranes are moist.     Pharynx: Oropharynx is clear.  Eyes:     Conjunctiva/sclera: Conjunctivae normal.     Pupils: Pupils are equal, round, and reactive to light.     Comments: Wearing glasses   Pulmonary:     Effort: Pulmonary effort is normal. No respiratory distress.  Musculoskeletal:     Right lower leg: No edema.  Skin:    General: Skin is warm and dry.     Findings: Erythema present.       Neurological:     General: No focal deficit present.     Mental Status: She is alert and oriented to person, place, and time.  Psychiatric:        Mood and Affect: Mood normal.        Behavior: Behavior normal.      Labs: No new pertinent labs   Imaging studies:  No new pertinent imagine studies     Assessment/Plan:  30 y.o. female with recurrent pilonidal cyst infection without drainable abscess on examination.   - Recommend completion of current regimen of Abx  - Recommend warm compresses at home as well  - Pain control; reviewed ibuprofen dosing. I will also send her a few oxycodone to help with pain, especially at nighttime - No appreciable fluctuance or abscess on examination to warrant I&D at this time. Reviewed the signs/symptoms of this, and she was encouraged to call if this develops. - Reviewed case with Dr . Will allow 1-2 weeks for infection to clear. After this, he will see her for operative planning.   - She understands to call in the interim with questions/concerns/worsening   All of the above recommendations were discussed with the patient and patient's family, and all of their questions were answered to their expressed satisfaction.  Thank you for the opportunity to participate in this patient's care.  -- 26, PA-C [facility] 04/01/2021, 3:18 PM [phone] M-F:  7am - 4pm

## 2021-04-13 ENCOUNTER — Ambulatory Visit: Payer: Managed Care, Other (non HMO) | Admitting: Surgery

## 2021-04-13 ENCOUNTER — Other Ambulatory Visit: Payer: Self-pay

## 2021-04-13 ENCOUNTER — Encounter: Payer: Self-pay | Admitting: Surgery

## 2021-04-13 VITALS — BP 103/72 | HR 80 | Temp 98.8°F | Ht 67.0 in | Wt 188.0 lb

## 2021-04-13 DIAGNOSIS — L0591 Pilonidal cyst without abscess: Secondary | ICD-10-CM | POA: Diagnosis not present

## 2021-04-13 NOTE — Progress Notes (Signed)
04/13/2021  History of Present Illness: Ann Simmons is a 30 y.o. female presenting for follow-up of an infected pilonidal cyst.  She was last seen on 10/26 for this but no I&D was required.  She continue her Augmentin treatment.  Today she reports that she has been doing better and the area is much less swollen and less tender compared to before.  Denies any drainage and she never had any drainage during this episode.  She does report that this is her fourth episode of a pilonidal abscess infection 1 of which required I&D at urgent care a year ago.  She reports that she gets a recurrence about once a year.  Denies any fevers, chills, chest pain, shortness of breath.  Past Medical History: Past Medical History:  Diagnosis Date   Anxiety    Asthma    Depression    Irritable bowel syndrome (IBS)      Past Surgical History: Past Surgical History:  Procedure Laterality Date   NO PAST SURGERIES N/A     Home Medications: Prior to Admission medications   Medication Sig Start Date End Date Taking? Authorizing Provider  norethindrone-ethinyl estradiol 1/35 (ORTHO-NOVUM 1/35, 28,) tablet Take 1 tablet by mouth daily. 12/11/20  Yes Conard Novak, MD  PROAIR HFA 108 [phone] Base) MCG/ACT inhaler Inhale 2 puffs into the lungs every 4 (four) hours as needed. 08/19/20  Yes [provider]    Allergies: Allergies  Allergen Reactions   Casein Rash    Review of Systems: Review of Systems  Constitutional:  Negative for chills and fever.  HENT:  Negative for hearing loss.   Respiratory:  Negative for shortness of breath.   Cardiovascular:  Negative for chest pain.  Gastrointestinal:  Negative for abdominal pain, nausea and vomiting.  Genitourinary:  Negative for dysuria.  Musculoskeletal:  Negative for myalgias.  Skin:  Negative for rash.  Neurological:  Negative for dizziness.  Psychiatric/Behavioral:  Negative for depression.    Physical Exam BP 103/72   Pulse 80   Temp 98.8 F  (37.1 C)   Ht 5\' 7"  (1.702 m)   Wt 188 lb (85.3 kg)   LMP 03/20/2021 (Approximate)   SpO2 98%   BMI 29.44 kg/m  CONSTITUTIONAL: No acute distress HEENT:  Normocephalic, atraumatic, extraocular motion intact. NECK: Trachea is midline, no jugular venous distention. RESPIRATORY:  Lungs are clear, and breath sounds are equal bilaterally. Normal respiratory effort without pathologic use of accessory muscles. CARDIOVASCULAR: Heart is regular without murmurs, gallops, or rubs. GI: The abdomen is soft, nondistended, nontender.  MUSCULOSKELETAL: Normal gait, no peripheral edema. SKIN: Patient has the superior portion of the gluteal cleft, just to the left of midline, an area of mild induration consistent with her previously infected cyst.  The gluteal cleft itself does not appear indurated or with any tenderness to palpation.  The symptoms are mostly focused to the left upper portion of midline.  There is no drainage. NEUROLOGIC:  Motor and sensation is grossly normal.  Cranial nerves are grossly intact. PSYCH:  Alert and oriented to person, place and time. Affect is normal.  Labs/Imaging: None recently  Assessment and Plan: This is a 30 y.o. female with a previous infected pilonidal cyst.  - Discussed with the patient that at this point I am not feeling any specific fluctuance or mass or area of infection at the cleft itself which would be where the pilonidal cyst will be located.  All her symptoms are mostly in the left upper area  of the gluteal cleft right in the midline itself.  All this could still represent a pilonidal cyst, this could also be a sebaceous cyst of the skin.  Discussed with her that the treatment would be the same and that given her recurrences, I would recommend formal excision of the cyst.  Discussed with her that it would be better to do this in the operating room in case this truly were a pilonidal cyst as we would need to do further dissection and more layers of sutures to  close the wound.  Reviewed with her the surgery at length including the risks of bleeding, infection, injury to surrounding structures, that this is an outpatient procedure, postoperative recovery and pain control, activity restrictions, and she is willing to proceed. - We will schedule the patient for 04/23/2021 to allow for more time for the infection to cool down.  I spent 40 minutes dedicated to the care of this patient on the date of this encounter to include pre-visit review of records, face-to-face time with the patient discussing diagnosis and management, and any post-visit coordination of care.   Howie Ill, MD [facility]

## 2021-04-13 NOTE — Patient Instructions (Signed)
You have been seen today for a Pilonidal Cyst/skin cyst.  You will have surgery at [facility] with Dr Aleen Campi. See your [facility] Surgery sheet. Our surgery scheduler will call you to look at surgery dates and to go over information.    If we excise this area (surgery) in the future, you will need to arrange to be out of work for approximately 1-2 weeks and then have a family member change the dressing 1-2 times daily until this heals from the inside out.   Please call our office with any questions or concerns.    Pilonidal Cyst A pilonidal cyst is a fluid-filled sac. It forms beneath the skin near your tailbone, at the top of the crease of your buttocks. A pilonidal cyst that is not large or infected may not cause symptoms or problems. If the cyst becomes irritated or infected, it may fill with pus. This causes pain and swelling (pilonidal abscess). An infected cyst may need to be treated with medicine, drained, or removed. CAUSES The cause of a pilonidal cyst is not known. One cause may be a hair that grows into your skin (ingrown hair). RISK FACTORS Pilonidal cysts are more common in boys and men. Risk factors include: Having lots of hair near the crease of the buttocks. Being overweight. Having a pilonidal dimple. Wearing tight clothing. Not bathing or showering frequently. Sitting for long periods of time. SIGNS AND SYMPTOMS Signs and symptoms of a pilonidal cyst may include: Redness. Pain and tenderness. Warmth. Swelling. Pus. Fever. DIAGNOSIS Your health care provider may diagnose a pilonidal cyst based on your symptoms and a physical exam. The health care provider may do a blood test to check for infection. If your cyst is draining pus, your health care provider may take a sample of the drainage to be tested at a laboratory. TREATMENT Surgery is the usual treatment for an infected pilonidal cyst. You may also have to take medicines before surgery. The type of surgery you have depends  on the size and severity of the infected cyst. The different kinds of surgery include: Incision and drainage. This is a procedure to open and drain the cyst. Marsupialization. In this procedure, a large cyst or abscess may be opened and kept open by stitching the edges of the skin to the cyst walls. Cyst removal. This procedure involves opening the skin and removing all or part of the cyst. HOME CARE INSTRUCTIONS Follow all of your surgeon's instructions carefully if you had surgery. Take medicines only as directed by your health care provider. If you were prescribed an antibiotic medicine, finish it all even if you start to feel better. Keep the area around your pilonidal cyst clean and dry. Clean the area as directed by your health care provider. Pat the area dry with a clean towel. Do not rub it as this may cause bleeding. Remove hair from the area around the cyst as directed by your health care provider. Do not wear tight clothing or sit in one place for long periods of time. There are many different ways to close and cover an incision, including stitches, skin glue, and adhesive strips. Follow your health care provider's instructions on: Incision care. Bandage (dressing) changes and removal. Incision closure removal. SEEK MEDICAL CARE IF:  You have drainage, redness, swelling, or pain at the site of the cyst. You have a fever.   This information is not intended to replace advice given to you by your health care provider. Make sure you discuss any  questions you have with your health care provider.   Document Released: 05/21/2000 Document Revised: 06/14/2014 Document Reviewed: 10/11/2013 Elsevier Interactive Patient Education [employer]

## 2021-04-13 NOTE — H&P (View-Only) (Signed)
04/13/2021  History of Present Illness: Ann Simmons is a 30 y.o. female presenting for follow-up of an infected pilonidal cyst.  She was last seen on 10/26 for this but no I&D was required.  She continue her Augmentin treatment.  Today she reports that she has been doing better and the area is much less swollen and less tender compared to before.  Denies any drainage and she never had any drainage during this episode.  She does report that this is her fourth episode of a pilonidal abscess infection 1 of which required I&D at urgent care a year ago.  She reports that she gets a recurrence about once a year.  Denies any fevers, chills, chest pain, shortness of breath.  Past Medical History: Past Medical History:  Diagnosis Date   Anxiety    Asthma    Depression    Irritable bowel syndrome (IBS)      Past Surgical History: Past Surgical History:  Procedure Laterality Date   NO PAST SURGERIES N/A     Home Medications: Prior to Admission medications   Medication Sig Start Date End Date Taking? Authorizing Provider  norethindrone-ethinyl estradiol 1/35 (ORTHO-NOVUM 1/35, 28,) tablet Take 1 tablet by mouth daily. 12/11/20  Yes Conard Novak, MD  PROAIR HFA 108 [phone] Base) MCG/ACT inhaler Inhale 2 puffs into the lungs every 4 (four) hours as needed. 08/19/20  Yes [provider]    Allergies: Allergies  Allergen Reactions   Casein Rash    Review of Systems: Review of Systems  Constitutional:  Negative for chills and fever.  HENT:  Negative for hearing loss.   Respiratory:  Negative for shortness of breath.   Cardiovascular:  Negative for chest pain.  Gastrointestinal:  Negative for abdominal pain, nausea and vomiting.  Genitourinary:  Negative for dysuria.  Musculoskeletal:  Negative for myalgias.  Skin:  Negative for rash.  Neurological:  Negative for dizziness.  Psychiatric/Behavioral:  Negative for depression.    Physical Exam BP 103/72   Pulse 80   Temp 98.8 F  (37.1 C)   Ht 5\' 7"  (1.702 m)   Wt 188 lb (85.3 kg)   LMP 03/20/2021 (Approximate)   SpO2 98%   BMI 29.44 kg/m  CONSTITUTIONAL: No acute distress HEENT:  Normocephalic, atraumatic, extraocular motion intact. NECK: Trachea is midline, no jugular venous distention. RESPIRATORY:  Lungs are clear, and breath sounds are equal bilaterally. Normal respiratory effort without pathologic use of accessory muscles. CARDIOVASCULAR: Heart is regular without murmurs, gallops, or rubs. GI: The abdomen is soft, nondistended, nontender.  MUSCULOSKELETAL: Normal gait, no peripheral edema. SKIN: Patient has the superior portion of the gluteal cleft, just to the left of midline, an area of mild induration consistent with her previously infected cyst.  The gluteal cleft itself does not appear indurated or with any tenderness to palpation.  The symptoms are mostly focused to the left upper portion of midline.  There is no drainage. NEUROLOGIC:  Motor and sensation is grossly normal.  Cranial nerves are grossly intact. PSYCH:  Alert and oriented to person, place and time. Affect is normal.  Labs/Imaging: None recently  Assessment and Plan: This is a 30 y.o. female with a previous infected pilonidal cyst.  - Discussed with the patient that at this point I am not feeling any specific fluctuance or mass or area of infection at the cleft itself which would be where the pilonidal cyst will be located.  All her symptoms are mostly in the left upper area  of the gluteal cleft right in the midline itself.  All this could still represent a pilonidal cyst, this could also be a sebaceous cyst of the skin.  Discussed with her that the treatment would be the same and that given her recurrences, I would recommend formal excision of the cyst.  Discussed with her that it would be better to do this in the operating room in case this truly were a pilonidal cyst as we would need to do further dissection and more layers of sutures to  close the wound.  Reviewed with her the surgery at length including the risks of bleeding, infection, injury to surrounding structures, that this is an outpatient procedure, postoperative recovery and pain control, activity restrictions, and she is willing to proceed. - We will schedule the patient for 04/23/2021 to allow for more time for the infection to cool down.  I spent 40 minutes dedicated to the care of this patient on the date of this encounter to include pre-visit review of records, face-to-face time with the patient discussing diagnosis and management, and any post-visit coordination of care.   Howie Ill, MD [facility]

## 2021-04-14 ENCOUNTER — Telehealth: Payer: Self-pay | Admitting: Surgery

## 2021-04-14 NOTE — Telephone Encounter (Signed)
Patient has been advised of Pre-Admission date/time, COVID Testing date and Surgery date.  Surgery Date: 04/23/21 Preadmission Testing Date: 04/20/21 (phone 8a-1p) Covid Testing Date: Not needed.     Patient has been made aware to call [phone], between 1-3:00pm the day before surgery, to find out what time to arrive for surgery.

## 2021-04-20 ENCOUNTER — Encounter
Admission: RE | Admit: 2021-04-20 | Discharge: 2021-04-20 | Disposition: A | Discharge status: Home / Self Care | Payer: Managed Care, Other (non HMO) | Source: Ambulatory Visit | Attending: Surgery | Admitting: Surgery

## 2021-04-20 ENCOUNTER — Other Ambulatory Visit: Payer: Self-pay

## 2021-04-20 MED ORDER — EPHEDRINE 5 MG/ML INJ
INTRAVENOUS | Status: AC
  Filled 2021-04-20: qty 5

## 2021-04-20 NOTE — Patient Instructions (Addendum)
Your procedure is scheduled on: Thursday, November 17 Report to the Registration Desk on the 1st floor of the [facility] To find out your arrival time, please call [phone] between 1PM - 3PM on: Wednesday, November 16  REMEMBER: Instructions that are not followed completely may result in serious medical risk, up to and including death; or upon the discretion of your surgeon and anesthesiologist your surgery may need to be rescheduled.  Do not eat food after midnight the night before surgery.  No gum chewing, lozengers or hard candies.  You may however, drink CLEAR liquids up to 2 hours before you are scheduled to arrive for your surgery. Do not drink anything within 2 hours of your scheduled arrival time.  Clear liquids include: - water  - apple juice without pulp - gatorade (not RED, PURPLE, OR BLUE) - black coffee or tea (Do NOT add milk or creamers to the coffee or tea) Do NOT drink anything that is not on this list.  TAKE THESE MEDICATIONS THE MORNING OF SURGERY WITH A SIP OF WATER:  Proair inhaler  One week prior to surgery: Stop Anti-inflammatories (NSAIDS) such as Advil, Aleve, Ibuprofen, Motrin, Naproxen, Naprosyn and Aspirin based products such as Excedrin, Goodys Powder, BC Powder. Stop ANY OVER THE COUNTER supplements until after surgery. You may however, continue to take Tylenol if needed for pain up until the day of surgery.  No Alcohol for 24 hours before or after surgery.  No Smoking including e-cigarettes for 24 hours prior to surgery.  No chewable tobacco products for at least 6 hours prior to surgery.  No nicotine patches on the day of surgery.  Do not use any "recreational" drugs for at least a week prior to your surgery.  Please be advised that the combination of cocaine and anesthesia may have negative outcomes, up to and including death. If you test positive for cocaine, your surgery will be cancelled.  On the morning of surgery brush your teeth  with toothpaste and water, you may rinse your mouth with mouthwash if you wish. Do not swallow any toothpaste or mouthwash.  Shower using antibacterial soap prior to coming to the hospital on the day of surgery.  Do not wear jewelry, make-up, hairpins, clips or nail polish.  Do not wear lotions, powders, or perfumes.   Do not shave body from the neck down 48 hours prior to surgery just in case you cut yourself which could leave a site for infection.   Contact lenses, hearing aids and dentures may not be worn into surgery.  Do not bring valuables to the hospital. [facility] is not responsible for any missing/lost belongings or valuables.   Notify your doctor if there is any change in your medical condition (cold, fever, infection).  Wear comfortable clothing (specific to your surgery type) to the hospital.  After surgery, you can help prevent lung complications by doing breathing exercises.  Take deep breaths and cough every 1-2 hours. Your doctor may order a device called an Incentive Spirometer to help you take deep breaths.  If you are being discharged the day of surgery, you will not be allowed to drive home. You will need a responsible adult (18 years or older) to drive you home and stay with you that night.   If you are taking public transportation, you will need to have a responsible adult (18 years or older) with you. Please confirm with your physician that it is acceptable to use public transportation.   Please  call the [facility] at [phone] if you have any questions about these instructions.  Surgery Visitation Policy:  Patients undergoing a surgery or procedure may have one family member or support person with them as long as that person is not COVID-19 positive or experiencing its symptoms.  That person may remain in the waiting area during the procedure and may rotate out with other people.

## 2021-04-23 ENCOUNTER — Ambulatory Visit: Payer: Managed Care, Other (non HMO) | Admitting: Anesthesiology

## 2021-04-23 ENCOUNTER — Encounter
Admission: RE | Disposition: A | Discharge status: Home / Self Care | Payer: Self-pay | Source: Home / Self Care | Attending: Surgery

## 2021-04-23 ENCOUNTER — Ambulatory Visit
Admission: RE | Admit: 2021-04-23 | Discharge: 2021-04-23 | Disposition: A | Discharge status: Home / Self Care | Payer: Managed Care, Other (non HMO) | Attending: Surgery | Admitting: Surgery

## 2021-04-23 ENCOUNTER — Encounter: Payer: Self-pay | Admitting: Surgery

## 2021-04-23 DIAGNOSIS — Z87891 Personal history of nicotine dependence: Secondary | ICD-10-CM | POA: Insufficient documentation

## 2021-04-23 DIAGNOSIS — J45909 Unspecified asthma, uncomplicated: Secondary | ICD-10-CM | POA: Insufficient documentation

## 2021-04-23 DIAGNOSIS — K219 Gastro-esophageal reflux disease without esophagitis: Secondary | ICD-10-CM | POA: Insufficient documentation

## 2021-04-23 DIAGNOSIS — L0591 Pilonidal cyst without abscess: Secondary | ICD-10-CM

## 2021-04-23 HISTORY — PX: PILONIDAL CYST EXCISION: SHX744

## 2021-04-23 LAB — POCT PREGNANCY, URINE: Preg Test, Ur: NEGATIVE

## 2021-04-23 SURGERY — EXCISION, PILONIDAL CYST, EXTENSIVE
Anesthesia: General | Site: Coccyx

## 2021-04-23 MED ORDER — ORAL CARE MOUTH RINSE: 15.0000 mL | Freq: Once | OROMUCOSAL | Status: AC

## 2021-04-23 MED ORDER — OXYCODONE HCL 5 MG/5ML PO SOLN: 5.0000 mg | Freq: Once | ORAL | Status: DC | PRN

## 2021-04-23 MED ORDER — ROCURONIUM BROMIDE 100 MG/10ML IV SOLN
INTRAVENOUS | Status: DC | PRN
Start: 2021-04-23 — End: 2021-04-23
  Administered 2021-04-23: 5 mg via INTRAVENOUS

## 2021-04-23 MED ORDER — BUPIVACAINE LIPOSOME 1.3 % IJ SUSP
INTRAMUSCULAR | Status: AC
  Filled 2021-04-23: qty 20

## 2021-04-23 MED ORDER — ONDANSETRON HCL 4 MG/2ML IJ SOLN
INTRAMUSCULAR | Status: DC | PRN
  Administered 2021-04-23: 4 mg via INTRAVENOUS

## 2021-04-23 MED ORDER — ACETAMINOPHEN 500 MG PO TABS
ORAL_TABLET | ORAL | Status: AC
  Administered 2021-04-23: 06:00:00 1000 mg via ORAL
  Filled 2021-04-23: qty 2

## 2021-04-23 MED ORDER — CEFAZOLIN SODIUM-DEXTROSE 2-4 GM/100ML-% IV SOLN
2.0000 g | INTRAVENOUS | Status: AC
  Administered 2021-04-23: 08:00:00 2 g via INTRAVENOUS

## 2021-04-23 MED ORDER — CHLORHEXIDINE GLUCONATE 0.12 % MT SOLN: 15.0000 mL | Freq: Once | OROMUCOSAL | Status: AC

## 2021-04-23 MED ORDER — PROMETHAZINE HCL 25 MG/ML IJ SOLN
INTRAMUSCULAR | Status: AC
  Filled 2021-04-23: qty 1

## 2021-04-23 MED ORDER — LIDOCAINE HCL (PF) 2 % IJ SOLN
INTRAMUSCULAR | Status: AC
  Filled 2021-04-23: qty 5

## 2021-04-23 MED ORDER — ACETAMINOPHEN 500 MG PO TABS: 1000.0000 mg | ORAL_TABLET | ORAL | Status: AC

## 2021-04-23 MED ORDER — OXYCODONE HCL 5 MG PO TABS: 5.0000 mg | ORAL_TABLET | ORAL | 0 refills | Status: DC | PRN

## 2021-04-23 MED ORDER — KETAMINE HCL 50 MG/ML IJ SOLN
INTRAMUSCULAR | Status: DC | PRN
  Administered 2021-04-23: 20 mg via INTRAMUSCULAR
  Administered 2021-04-23: 30 mg via INTRAMUSCULAR

## 2021-04-23 MED ORDER — SUCCINYLCHOLINE CHLORIDE 200 MG/10ML IV SOSY
PREFILLED_SYRINGE | INTRAVENOUS | Status: AC
  Filled 2021-04-23: qty 10

## 2021-04-23 MED ORDER — ONDANSETRON HCL 4 MG/2ML IJ SOLN
4.0000 mg | Freq: Once | INTRAMUSCULAR | Status: AC | PRN
  Administered 2021-04-23: 09:00:00 4 mg via INTRAVENOUS

## 2021-04-23 MED ORDER — DEXMEDETOMIDINE (PRECEDEX) IN NS 20 MCG/5ML (4 MCG/ML) IV SYRINGE
PREFILLED_SYRINGE | INTRAVENOUS | Status: AC
  Filled 2021-04-23: qty 5

## 2021-04-23 MED ORDER — ACETAMINOPHEN 10 MG/ML IV SOLN: 1000.0000 mg | Freq: Once | INTRAVENOUS | Status: DC | PRN

## 2021-04-23 MED ORDER — PROPOFOL 10 MG/ML IV BOLUS
INTRAVENOUS | Status: AC
  Filled 2021-04-23: qty 20

## 2021-04-23 MED ORDER — GABAPENTIN 300 MG PO CAPS: 300.0000 mg | ORAL_CAPSULE | ORAL | Status: AC

## 2021-04-23 MED ORDER — PROMETHAZINE HCL 25 MG/ML IJ SOLN
INTRAMUSCULAR | Status: AC
  Administered 2021-04-23: 10:00:00 6.25 mg via INTRAVENOUS
  Filled 2021-04-23: qty 1

## 2021-04-23 MED ORDER — KETOROLAC TROMETHAMINE 30 MG/ML IJ SOLN
INTRAMUSCULAR | Status: AC
  Filled 2021-04-23: qty 1

## 2021-04-23 MED ORDER — CHLORHEXIDINE GLUCONATE 0.12 % MT SOLN
OROMUCOSAL | Status: AC
  Administered 2021-04-23: 06:00:00 15 mL via OROMUCOSAL
  Filled 2021-04-23: qty 15

## 2021-04-23 MED ORDER — LACTATED RINGERS IV BOLUS
500.0000 mL | Freq: Once | INTRAVENOUS | Status: AC
Start: 2021-04-23 — End: 2021-04-23
  Administered 2021-04-23: 10:00:00 500 mL via INTRAVENOUS

## 2021-04-23 MED ORDER — PROMETHAZINE HCL 25 MG/ML IJ SOLN
6.2500 mg | INTRAMUSCULAR | Status: AC | PRN
  Administered 2021-04-23: 10:00:00 6.25 mg via INTRAVENOUS

## 2021-04-23 MED ORDER — FENTANYL CITRATE (PF) 100 MCG/2ML IJ SOLN
INTRAMUSCULAR | Status: AC
  Filled 2021-04-23: qty 2

## 2021-04-23 MED ORDER — BUPIVACAINE-EPINEPHRINE (PF) 0.5% -1:200000 IJ SOLN
INTRAMUSCULAR | Status: AC
  Filled 2021-04-23: qty 30

## 2021-04-23 MED ORDER — FAMOTIDINE 20 MG PO TABS: 20.0000 mg | ORAL_TABLET | Freq: Once | ORAL | Status: AC

## 2021-04-23 MED ORDER — CEFAZOLIN SODIUM-DEXTROSE 2-4 GM/100ML-% IV SOLN
INTRAVENOUS | Status: AC
  Filled 2021-04-23: qty 100

## 2021-04-23 MED ORDER — SUGAMMADEX SODIUM 200 MG/2ML IV SOLN
INTRAVENOUS | Status: DC | PRN
  Administered 2021-04-23: 180 mg via INTRAVENOUS

## 2021-04-23 MED ORDER — CHLORHEXIDINE GLUCONATE CLOTH 2 % EX PADS: 6.0000 | MEDICATED_PAD | Freq: Once | CUTANEOUS | Status: DC

## 2021-04-23 MED ORDER — DEXAMETHASONE SODIUM PHOSPHATE 10 MG/ML IJ SOLN
INTRAMUSCULAR | Status: DC | PRN
  Administered 2021-04-23: 10 mg via INTRAVENOUS

## 2021-04-23 MED ORDER — ONDANSETRON HCL 4 MG/2ML IJ SOLN
INTRAMUSCULAR | Status: AC
  Filled 2021-04-23: qty 2

## 2021-04-23 MED ORDER — FENTANYL CITRATE (PF) 100 MCG/2ML IJ SOLN: 25.0000 ug | INTRAMUSCULAR | Status: DC | PRN

## 2021-04-23 MED ORDER — LIDOCAINE HCL (CARDIAC) PF 100 MG/5ML IV SOSY
PREFILLED_SYRINGE | INTRAVENOUS | Status: DC | PRN
  Administered 2021-04-23: 60 mg via INTRAVENOUS

## 2021-04-23 MED ORDER — SODIUM CHLORIDE (PF) 0.9 % IJ SOLN
INTRAMUSCULAR | Status: AC
  Filled 2021-04-23: qty 10

## 2021-04-23 MED ORDER — MIDAZOLAM HCL 2 MG/2ML IJ SOLN
INTRAMUSCULAR | Status: AC
  Filled 2021-04-23: qty 2

## 2021-04-23 MED ORDER — LACTATED RINGERS IV SOLN: INTRAVENOUS | Status: DC

## 2021-04-23 MED ORDER — ONDANSETRON 4 MG PO TBDP: 4.0000 mg | ORAL_TABLET | Freq: Three times a day (TID) | ORAL | 0 refills | Status: AC | PRN

## 2021-04-23 MED ORDER — KETOROLAC TROMETHAMINE 30 MG/ML IJ SOLN
INTRAMUSCULAR | Status: DC | PRN
  Administered 2021-04-23: 30 mg via INTRAVENOUS

## 2021-04-23 MED ORDER — GABAPENTIN 300 MG PO CAPS
ORAL_CAPSULE | ORAL | Status: AC
  Administered 2021-04-23: 06:00:00 300 mg via ORAL
  Filled 2021-04-23: qty 1

## 2021-04-23 MED ORDER — PROPOFOL 10 MG/ML IV BOLUS
INTRAVENOUS | Status: DC | PRN
  Administered 2021-04-23: 200 mg via INTRAVENOUS
  Administered 2021-04-23: 50 mg via INTRAVENOUS

## 2021-04-23 MED ORDER — KETAMINE HCL 50 MG/5ML IJ SOSY
PREFILLED_SYRINGE | INTRAMUSCULAR | Status: AC
  Filled 2021-04-23: qty 5

## 2021-04-23 MED ORDER — DEXAMETHASONE SODIUM PHOSPHATE 10 MG/ML IJ SOLN
INTRAMUSCULAR | Status: AC
  Filled 2021-04-23: qty 1

## 2021-04-23 MED ORDER — MIDAZOLAM HCL 2 MG/2ML IJ SOLN
INTRAMUSCULAR | Status: DC | PRN
  Administered 2021-04-23: 2 mg via INTRAVENOUS

## 2021-04-23 MED ORDER — OXYCODONE HCL 5 MG PO TABS: 5.0000 mg | ORAL_TABLET | Freq: Once | ORAL | Status: DC | PRN

## 2021-04-23 MED ORDER — BUPIVACAINE LIPOSOME 1.3 % IJ SUSP: 20.0000 mL | Freq: Once | INTRAMUSCULAR | Status: DC

## 2021-04-23 MED ORDER — BUPIVACAINE-EPINEPHRINE (PF) 0.5% -1:200000 IJ SOLN
INTRAMUSCULAR | Status: DC | PRN
  Administered 2021-04-23: 08:00:00 50 mL

## 2021-04-23 MED ORDER — DEXMEDETOMIDINE (PRECEDEX) IN NS 20 MCG/5ML (4 MCG/ML) IV SYRINGE
PREFILLED_SYRINGE | INTRAVENOUS | Status: DC | PRN
  Administered 2021-04-23 (×4): 10 ug via INTRAVENOUS

## 2021-04-23 MED ORDER — IBUPROFEN 800 MG PO TABS: 800.0000 mg | ORAL_TABLET | Freq: Three times a day (TID) | ORAL | 1 refills | Status: AC | PRN

## 2021-04-23 MED ORDER — FAMOTIDINE 20 MG PO TABS
ORAL_TABLET | ORAL | Status: AC
  Administered 2021-04-23: 06:00:00 20 mg via ORAL
  Filled 2021-04-23: qty 1

## 2021-04-23 MED ORDER — ROCURONIUM BROMIDE 10 MG/ML (PF) SYRINGE
PREFILLED_SYRINGE | INTRAVENOUS | Status: AC
  Filled 2021-04-23: qty 10

## 2021-04-23 MED ORDER — 0.9 % SODIUM CHLORIDE (POUR BTL) OPTIME
TOPICAL | Status: DC | PRN
  Administered 2021-04-23: 08:00:00 500 mL

## 2021-04-23 MED ORDER — FENTANYL CITRATE (PF) 100 MCG/2ML IJ SOLN
INTRAMUSCULAR | Status: DC | PRN
  Administered 2021-04-23: 25 ug via INTRAVENOUS
  Administered 2021-04-23: 50 ug via INTRAVENOUS
  Administered 2021-04-23: 25 ug via INTRAVENOUS
  Administered 2021-04-23 (×2): 50 ug via INTRAVENOUS

## 2021-04-23 MED ORDER — ACETAMINOPHEN 500 MG PO TABS: 1000.0000 mg | ORAL_TABLET | Freq: Four times a day (QID) | ORAL | Status: DC | PRN

## 2021-04-23 MED ORDER — SUCCINYLCHOLINE CHLORIDE 200 MG/10ML IV SOSY
PREFILLED_SYRINGE | INTRAVENOUS | Status: DC | PRN
Start: 2021-04-23 — End: 2021-04-23
  Administered 2021-04-23: 100 mg via INTRAVENOUS

## 2021-04-23 SURGICAL SUPPLY — 43 items
BLADE SURG 15 STRL LF DISP TIS (BLADE) ×1 IMPLANT
BLADE SURG 15 STRL SS (BLADE) ×2
BRIEF STRETCH FOR OB PAD XXL (UNDERPADS AND DIAPERS) ×2 IMPLANT
DRAPE LAPAROTOMY 100X77 ABD (DRAPES) ×2 IMPLANT
ELECT CAUTERY BLADE TIP 2.5 (TIP) ×2
ELECT REM PT RETURN 9FT ADLT (ELECTROSURGICAL) ×2
ELECTRODE CAUTERY BLDE TIP 2.5 (TIP) ×1 IMPLANT
ELECTRODE REM PT RTRN 9FT ADLT (ELECTROSURGICAL) ×1 IMPLANT
GAUZE 4X4 16PLY ~~LOC~~+RFID DBL (SPONGE) ×2 IMPLANT
GAUZE SPONGE 4X4 12PLY STRL (GAUZE/BANDAGES/DRESSINGS) ×2 IMPLANT
GLOVE SURG SYN 7.0 (GLOVE) ×2 IMPLANT
GLOVE SURG SYN 7.0 PF PI (GLOVE) ×1 IMPLANT
GLOVE SURG SYN 7.5  E (GLOVE) ×1
GLOVE SURG SYN 7.5 E (GLOVE) ×1 IMPLANT
GLOVE SURG SYN 7.5 PF PI (GLOVE) ×1 IMPLANT
GOWN STRL REUS W/ TWL LRG LVL3 (GOWN DISPOSABLE) ×2 IMPLANT
GOWN STRL REUS W/TWL LRG LVL3 (GOWN DISPOSABLE) ×4
IV CATH ANGIO 14GX1.88 NO SAFE (IV SOLUTION) ×2 IMPLANT
KIT TURNOVER KIT A (KITS) ×2 IMPLANT
LABEL OR SOLS (LABEL) ×2 IMPLANT
MANIFOLD NEPTUNE II (INSTRUMENTS) ×2 IMPLANT
NEEDLE HYPO 22GX1.5 SAFETY (NEEDLE) ×2 IMPLANT
NS IRRIG 500ML POUR BTL (IV SOLUTION) ×2 IMPLANT
PACK BASIN MINOR ARMC (MISCELLANEOUS) ×2 IMPLANT
PUNCH BIOPSY 3 (MISCELLANEOUS) IMPLANT
PUNCH BIOPSY 4MM (MISCELLANEOUS)
PUNCH BIOPSY DERMAL 6MM STRL (MISCELLANEOUS) IMPLANT
PUNCH BIOPSY DISP 4 (MISCELLANEOUS) IMPLANT
SOL PREP PVP 2OZ (MISCELLANEOUS) ×2
SOLUTION PREP PVP 2OZ (MISCELLANEOUS) ×1 IMPLANT
SUT ETHILON 2 0 FS 18 (SUTURE) ×1 IMPLANT
SUT ETHILON 3-0 FS-10 30 BLK (SUTURE) ×2
SUT MNCRL 4-0 (SUTURE) ×2
SUT MNCRL 4-0 27XMFL (SUTURE) ×1
SUT VIC AB 2-0 SH 27 (SUTURE) ×4
SUT VIC AB 2-0 SH 27XBRD (SUTURE) IMPLANT
SUT VIC AB 3-0 SH 27 (SUTURE) ×6
SUT VIC AB 3-0 SH 27X BRD (SUTURE) IMPLANT
SUTURE EHLN 3-0 FS-10 30 BLK (SUTURE) IMPLANT
SUTURE MNCRL 4-0 27XMF (SUTURE) IMPLANT
SYR 20ML LL LF (SYRINGE) ×2 IMPLANT
SYR BULB IRRIG 60ML STRL (SYRINGE) ×2 IMPLANT
WATER STERILE IRR 500ML POUR (IV SOLUTION) ×2 IMPLANT

## 2021-04-23 NOTE — Transfer of Care (Signed)
Immediate Anesthesia Transfer of Care Note  Patient: Ann Simmons  Procedure(s) Performed: CYST EXCISION PILONIDAL EXTENSIVE (Coccyx)  Patient Location: PACU  Anesthesia Type:General  Level of Consciousness: awake, drowsy and patient cooperative  Airway & Oxygen Therapy: Patient Spontanous Breathing and Patient connected to face mask oxygen  Post-op Assessment: Report given to RN and Post -op Vital signs reviewed and stable  Post vital signs: Reviewed and stable  Last Vitals:  Vitals Value Taken Time  BP 93/61 04/23/21 0904  Temp    Pulse 60 04/23/21 0909  Resp 15 04/23/21 0909  SpO2 100 % 04/23/21 0909  Vitals shown include unvalidated device data.  Last Pain: There were no vitals filed for this visit.       Complications: No notable events documented.

## 2021-04-23 NOTE — Anesthesia Preprocedure Evaluation (Signed)
Anesthesia Evaluation  Patient identified by MRN, date of birth, ID band Patient awake    Reviewed: Allergy & Precautions, NPO status , Patient's Chart, lab work & pertinent test results  History of Anesthesia Complications Negative for: history of anesthetic complications  Airway Mallampati: I   Neck ROM: Full    Dental no notable dental hx.    Pulmonary asthma , former smoker (quit 15 years ago),    Pulmonary exam normal breath sounds clear to auscultation       Cardiovascular Exercise Tolerance: Good negative cardio ROS Normal cardiovascular exam Rhythm:Regular Rate:Normal     Neuro/Psych PSYCHIATRIC DISORDERS Anxiety Depression negative neurological ROS     GI/Hepatic GERD  ,  Endo/Other  negative endocrine ROS  Renal/GU negative Renal ROS     Musculoskeletal   Abdominal   Peds  Hematology negative hematology ROS (+)   Anesthesia Other Findings   Reproductive/Obstetrics                             Anesthesia Physical Anesthesia Plan  ASA: 2  Anesthesia Plan: General   Post-op Pain Management:    Induction: Intravenous  PONV Risk Score and Plan: 3 and Ondansetron, Dexamethasone and Treatment may vary due to age or medical condition  Airway Management Planned: Oral ETT  Additional Equipment:   Intra-op Plan:   Post-operative Plan: Extubation in OR  Informed Consent: I have reviewed the patients History and Physical, chart, labs and discussed the procedure including the risks, benefits and alternatives for the proposed anesthesia with the patient or authorized representative who has indicated his/her understanding and acceptance.     Dental advisory given  Plan Discussed with: CRNA  Anesthesia Plan Comments: (Patient consented for risks of anesthesia including but not limited to:  - adverse reactions to medications - damage to eyes, teeth, lips or other oral  mucosa - nerve damage due to positioning  - sore throat or hoarseness - damage to heart, brain, nerves, lungs, other parts of body or loss of life  Informed patient about role of CRNA in peri- and intra-operative care.  Patient voiced understanding.)        Anesthesia Quick Evaluation

## 2021-04-23 NOTE — Anesthesia Procedure Notes (Signed)
Procedure Name: Intubation Date/Time: 04/23/2021 7:41 AM Performed by: Debe Coder, CRNA Pre-anesthesia Checklist: Patient identified, Emergency Drugs available, Suction available and Patient being monitored Patient Re-evaluated:Patient Re-evaluated prior to induction Oxygen Delivery Method: Circle system utilized Preoxygenation: Pre-oxygenation with 100% oxygen Induction Type: IV induction Ventilation: Mask ventilation without difficulty Laryngoscope Size: Mac and 3 Grade View: Grade I Tube type: Oral Tube size: 7.0 mm Number of attempts: 1 Airway Equipment and Method: Stylet and Oral airway Placement Confirmation: ETT inserted through vocal cords under direct vision, positive ETCO2 and breath sounds checked- equal and bilateral Secured at: 21 cm Tube secured with: Tape Dental Injury: Teeth and Oropharynx as per pre-operative assessment

## 2021-04-23 NOTE — Interval H&P Note (Signed)
History and Physical Interval Note:  04/23/2021 7:10 AM  Ann Simmons  has presented today for surgery, with the diagnosis of pilonidal cyst.  The various methods of treatment have been discussed with the patient and family. After consideration of risks, benefits and other options for treatment, the patient has consented to  Procedure(s) with comments: CYST EXCISION PILONIDAL EXTENSIVE (N/A) - provider requesting 1 hour as a surgical intervention.  The patient's history has been reviewed, patient examined, no change in status, stable for surgery.  I have reviewed the patient's chart and labs.  Questions were answered to the patient's satisfaction.     Nahla Lukin

## 2021-04-23 NOTE — Op Note (Signed)
  Procedure Date:  04/23/2021  Pre-operative Diagnosis:  Pilonidal Cyst  Post-operative Diagnosis: Pilonidal Cyst  Procedure:  Pilonidal cyst excision  Surgeon:  Howie Ill, MD  Anesthesia:  General endotracheal  Estimated Blood Loss:  5 ml  Specimens:  Pilonidal cyst  Complications:  None  Indications for Procedure:  This is a 30 y.o. female with a previously infected pilonidal cyst, which was treated with antibiotics.  She wishes to have this excised to prevent recurrence.  The options of surgery versus observation were reviewed with the patient and/or family. The risks of bleeding, infection, recurrence of symptoms, abscess or infection, were all discussed with the patient and she was willing to proceed.  Description of Procedure: The patient was correctly identified in the preoperative area and brought into the operating room.  The patient was placed supine with VTE prophylaxis in place.  Appropriate time-outs were performed.  Anesthesia was induced and the patient was intubated.  The patient was then placed in prone position. Appropriate antibiotics were infused.  The pilonidal area was prepped and draped in usual sterile fashion.  A sinus pit for the pilonidal cyst was identified and probed, confirmed to be tracking into the cyst.  A 4 cm elliptical incision was made, incorporating the pilonidal cyst and pits.  Cautery was used to dissect down the subcutaneous tissues to the cyst and the cyst with skin was excised intact using cautery.  This was sent to pathology.  Then skin flaps were created using cautery.  The cavity was irrigated and hemostasis was assured.  Local anesthetic was infiltrated into the skin and subcutaneous tissue of the cavity.  The cavity was then closed in multiple layers using 2-0 Vicryl sutures, 3-0 Vicryl sutures, and 2-0 Nylon sutures for the skin.  The incision was cleaned and dressed with gauze and tape.  The patient was then placed back on supine  position, emerged from anesthesia, extubated, and brought to the recovery room for further management.  The patient tolerated the procedure well and all counts were correct at the end of the case.   Howie Ill, MD

## 2021-04-23 NOTE — Discharge Instructions (Addendum)

## 2021-04-24 LAB — SURGICAL PATHOLOGY

## 2021-04-24 NOTE — Anesthesia Postprocedure Evaluation (Signed)
Anesthesia Post Note  Patient: Ann Simmons  Procedure(s) Performed: CYST EXCISION PILONIDAL EXTENSIVE (Coccyx)  Patient location during evaluation: PACU Anesthesia Type: General Level of consciousness: awake and alert, oriented and patient cooperative Pain management: pain level controlled Vital Signs Assessment: post-procedure vital signs reviewed and stable Respiratory status: spontaneous breathing, nonlabored ventilation and respiratory function stable Cardiovascular status: blood pressure returned to baseline and stable Postop Assessment: adequate PO intake Anesthetic complications: no   No notable events documented.   Last Vitals:  Vitals:   04/23/21 0954 04/23/21 1049  BP: 124/70 (!) 86/55  Pulse: 73 60  Resp: 14 15  Temp: 36.6 C (!) 36.4 C  SpO2: 100% 100%    Last Pain:  Vitals:   04/23/21 1049  TempSrc:   PainSc: 0-No pain                 Reed Breech

## 2021-04-27 ENCOUNTER — Encounter: Payer: Managed Care, Other (non HMO) | Admitting: Surgery

## 2021-05-04 ENCOUNTER — Encounter: Payer: Self-pay | Admitting: Surgery

## 2021-05-04 ENCOUNTER — Other Ambulatory Visit: Payer: Self-pay

## 2021-05-04 ENCOUNTER — Ambulatory Visit (INDEPENDENT_AMBULATORY_CARE_PROVIDER_SITE_OTHER): Payer: Managed Care, Other (non HMO) | Admitting: Surgery

## 2021-05-04 VITALS — BP 106/73 | HR 73 | Temp 97.8°F | Ht 67.0 in | Wt 189.8 lb

## 2021-05-04 DIAGNOSIS — L0591 Pilonidal cyst without abscess: Secondary | ICD-10-CM

## 2021-05-04 DIAGNOSIS — Z09 Encounter for follow-up examination after completed treatment for conditions other than malignant neoplasm: Secondary | ICD-10-CM

## 2021-05-04 NOTE — Patient Instructions (Addendum)
If you have any concerns or questions, please feel free to call our office. Your follow up appointment is 05/08/2021 @ 8:45 am at [facility] in [place].  Pilonidal Cyst Removal, Care After The following information offers guidance on how to care for yourself after your procedure. Your health care provider may also give you more specific instructions. If you have problems or questions, contact your health care provider. What can I expect after the procedure? After the procedure, it is common to have: Pain. Redness. Some swelling. Wound drainage. You may have more drainage if you have an open incision. Follow these instructions at home: Medicines Take over-the-counter and prescription medicines only as told by your health care provider. If you were prescribed an antibiotic medicine, take it as told by your health care provider. Do not stop taking the antibiotic even if you start to feel better. Ask your health care provider if the medicine prescribed to you requires you to avoid driving or using machinery. Incision care  Follow instructions from your health care provider about how to take care of your incision. You may have a closed or open incision. If you have packing in your wound that needs to be removed and replaced, a wound care nurse may come to your home to change your packing and bandage (dressing). If you are changing your own dressing, make sure you: Wash your hands with soap and water for at least 20 seconds before and after you change your dressing. If soap and water are not available, use hand sanitizer. Change your dressing as told by your health care provider. Leave stitches (sutures), skin glue, or adhesive strips in place. These skin closures may need to stay in place for 2 weeks or longer. If adhesive strip edges start to loosen and curl up, you may trim the loose edges. Do not remove adhesive strips completely unless your health care provider tells you to do  that. Check your incision area every day for signs of infection. If it is hard to see the area, have someone check for you. Check for: More redness, swelling, or pain. More fluid or blood. Warmth. Pus or a bad smell. Managing pain and swelling If directed, put ice on the incision area. To do this: Put ice in a plastic bag. Place a towel between your skin and the bag. Leave the ice on for 20 minutes, 2-3 times a day. Remove the ice if your skin turns bright red. This is very important. If you cannot feel pain, heat, or cold, you have a greater risk of damage to the area. Activity Return to your normal activities as told by your health care provider. Ask your health care provider what activities are safe for you. Do not do any activities that irritate the incision area or cause pain. These can include: Sitting for a long time. Running. Twisting. Riding a bike. Doing sit-ups. General instructions Do not take baths, swim, or use a hot tub until your health care provider approves. Ask your health care provider if you may take showers. You may only be allowed to take sponge baths. You may need to take these actions to prevent or treat constipation: Drink enough fluid to keep your urine pale yellow. Take over-the-counter or prescription medicines. Eat foods that are high in fiber, such as beans, whole grains, and fresh fruits and vegetables. Limit foods that are high in fat and processed sugars, such as fried or sweet foods. Keep all follow-up visits. This is important. Contact a  health care provider if: You have chills or a fever. Your medicine is not controlling your pain. You have any of these signs of infection at your incision site: More redness, swelling, or pain. More fluid or blood. Warmth. Pus or a bad smell. Get help right away if: You have severe abdominal pain. You have sudden chest pain and shortness of breath. You cough up blood. You faint or lose consciousness. These  symptoms may represent a serious problem that is an emergency. Do not wait to see if the symptoms will go away. Get medical help right away. Call your local emergency services (911 in the [place] Do not drive yourself to the hospital. Summary After the procedure, it is common to have pain and wound drainage. You may have a closed or open incision. If you have an open incision with packing, a wound care nurse may help you with your dressing care. Do not do activities that cause pain or irritate your incision site. Contact your health care provider if your medicine is not controlling your pain or if you have chills, a fever, or any signs of infection. This information is not intended to replace advice given to you by your health care provider. Make sure you discuss any questions you have with your health care provider. Document Revised: 06/12/2020 Document Reviewed: 06/12/2020 Elsevier Patient Education  2022 [employer].

## 2021-05-04 NOTE — Progress Notes (Signed)
05/04/2021  HPI: Ann Simmons is a 30 y.o. female s/p excision of pilonidal cyst on 04/23/21.  She presents for follow up.  She has been doing well, but reports some increased pain at the incision site with the sutures.  Denies any drainage or redness of the incision.  Vital signs: BP 106/73   Pulse 73   Temp 97.8 F (36.6 C) (Oral)   Ht 5\' 7"  (1.702 m)   Wt 189 lb 12.8 oz (86.1 kg)   LMP 04/13/2021 (Exact Date)   SpO2 99%   BMI 29.73 kg/m    Physical Exam: Constitutional:  No acute distress Skin:  Gluteal cleft incision is healing well, clean, dry, intact with sutures in place.  Removed 2 of 5 sutures today and applied dry gauze dressing.   Assessment/Plan: This is a 30 y.o. female s/p pilonidal cyst excision.  --Two of five sutures removed today.  Will remove the remaining sutures on 05/08/21.  Discussed that some of the discomfort may be related to the skin healing over the sutures, as these were a bit harder to pull enough to cut.  No evidence of infection or any complications at this point. --May apply vitamin e ointment or lotion if desired.   14/2/22, MD [facility]

## 2021-05-08 ENCOUNTER — Encounter: Payer: Self-pay | Admitting: Surgery

## 2021-05-08 ENCOUNTER — Other Ambulatory Visit: Payer: Self-pay

## 2021-05-08 ENCOUNTER — Ambulatory Visit (INDEPENDENT_AMBULATORY_CARE_PROVIDER_SITE_OTHER): Payer: Managed Care, Other (non HMO) | Admitting: Surgery

## 2021-05-08 VITALS — BP 113/73 | HR 79 | Temp 98.1°F | Ht 67.0 in | Wt 189.8 lb

## 2021-05-08 DIAGNOSIS — Z09 Encounter for follow-up examination after completed treatment for conditions other than malignant neoplasm: Secondary | ICD-10-CM

## 2021-05-08 DIAGNOSIS — L0591 Pilonidal cyst without abscess: Secondary | ICD-10-CM

## 2021-05-08 NOTE — Patient Instructions (Signed)
Pleasde see your follow up appointment listed below.

## 2021-05-08 NOTE — Progress Notes (Signed)
05/08/2021  HPI: Ann Simmons is a 30 y.o. female s/p pilonidal cyst excision on 04/23/21.  Patient presents for follow-up.  She was last seen in the office on 04/26/2021 at which time half of the sutures were removed.  Today she reports that she is doing well.  Denies any worsening pain or significant drainage from the wound.  Vital signs: BP 113/73   Pulse 79   Temp 98.1 F (36.7 C) (Oral)   Ht 5\' 7"  (1.702 m)   Wt 189 lb 12.8 oz (86.1 kg)   LMP 04/13/2021 (Exact Date)   SpO2 98%   BMI 29.73 kg/m    Physical Exam: Constitutional: No acute distress Skin: Gluteal cleft incision is healing appropriately.  There is 2 small areas of very shallow dehiscence but otherwise no complicating factors.  The remaining sutures were removed.  Dry gauze dressing applied.  Assessment/Plan: This is a 30 y.o. female s/p pilonidal cyst excision.  - Remaining sutures were removed today.  There is 2 very shallow areas of dehiscence but otherwise no major complications.  Discussed with patient again some positioning restrictions so that we progress drain on the incision itself.  Continue dressing changes until wound is healed. - Follow-up in 2 weeks to reassess her wound.   26, MD [facility]

## 2021-05-22 ENCOUNTER — Encounter: Payer: Self-pay | Admitting: Surgery

## 2021-05-22 ENCOUNTER — Ambulatory Visit (INDEPENDENT_AMBULATORY_CARE_PROVIDER_SITE_OTHER): Payer: Managed Care, Other (non HMO) | Admitting: Surgery

## 2021-05-22 ENCOUNTER — Other Ambulatory Visit: Payer: Self-pay

## 2021-05-22 VITALS — BP 104/68 | HR 70 | Temp 98.2°F | Ht 67.0 in | Wt 184.4 lb

## 2021-05-22 DIAGNOSIS — Z09 Encounter for follow-up examination after completed treatment for conditions other than malignant neoplasm: Secondary | ICD-10-CM

## 2021-05-22 DIAGNOSIS — L0591 Pilonidal cyst without abscess: Secondary | ICD-10-CM

## 2021-05-22 NOTE — Patient Instructions (Signed)
You may shower as usual. While in the shower remove the packing. Allow soapy water to run over the wound. Be sure to rinse well. Repack the wound using a 2 2 x 2 moistened gauze. Place a dry dressing over the area and secure with tape.   Please see your follow up appointment listed below.

## 2021-05-22 NOTE — Progress Notes (Signed)
05/22/2021  HPI: Ann Simmons is a 30 y.o. female s/p pilonidal cyst excision on 04/23/21.  She presents today for follow up.  Her remaining sutures were removed on 12/2.  Today, she reports she's noticed a bit of drainage from the wound.  Reports soreness still in the area.  Vital signs: BP 104/68    Pulse 70    Temp 98.2 F (36.8 C) (Oral)    Ht 5\' 7"  (1.702 m)    Wt 184 lb 6.4 oz (83.6 kg)    SpO2 96%    BMI 28.88 kg/m    Physical Exam: Constitutional:  No acute distress Skin:  Gluteal cleft incision has opened superficially, with about 3-4 mm depth.  Granulation tissue is healthy, without any purulence or necrotic tissue.  Applied wet to dry gauze dressing.  Assessment/Plan: This is a 30 y.o. female s/p pilonidal cyst excision, with partial wound dehiscence.  --Discussed with patient that the wound has opened partially, not too deeply, and the wound edges are healthy.  Instructed on how to do wet to dry dressing changes daily. --Follow up in 2 weeks.   26, MD [facility]

## 2021-05-25 IMAGING — RF DG UGI W/ HIGH DENSITY W/O KUB
12 series · 12 of 12 positions shown · non-contrast
Comparison: No prior.

CLINICAL DATA: Nausea.

EXAM:
UPPER GI SERIES WITHOUT KUB
TECHNIQUE: Routine upper GI series was performed with high density and thin
barium.
FLUOROSCOPY TIME:  Fluoroscopy Time:  1 minutes 6 seconds
Radiation Exposure Index (if provided by the fluoroscopic device):
26.1 mGy

[Series 1: fluoro_barium singleshot_bw · 0.17mm/px · 1 of 1 slices shown (1 of 12)]
[im 1/1]
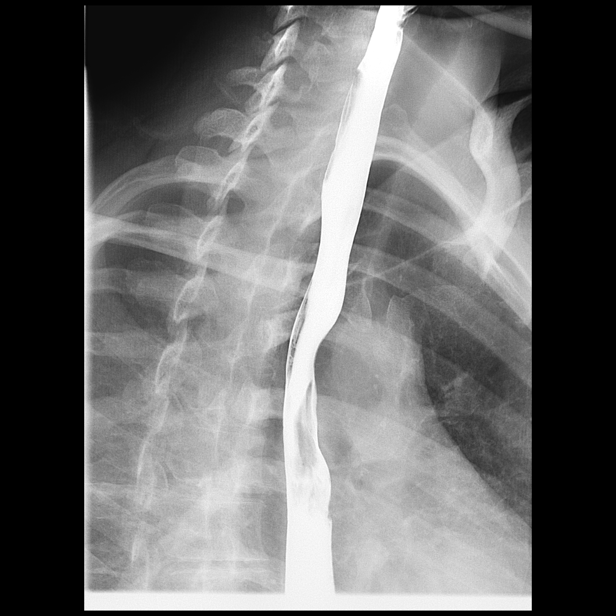

[Series 2: fluoro_barium singleshot_bw · 0.17mm/px · 1 of 1 slices shown (2 of 12)]
[im 1/1]
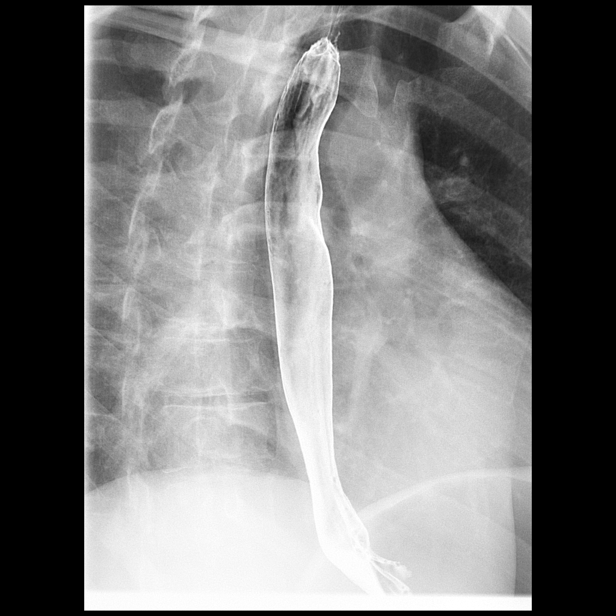

[Series 3: fluoro_barium singleshot_bw · 0.17mm/px · 1 of 1 slices shown (3 of 12)]
[im 1/1]
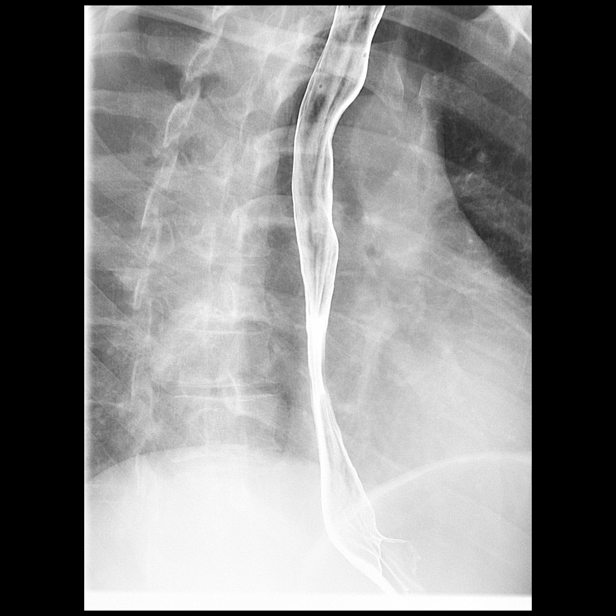

[Series 4: fluoro_barium singleshot_bw · 0.18mm/px · 1 of 1 slices shown (4 of 12)]
[im 1/1]
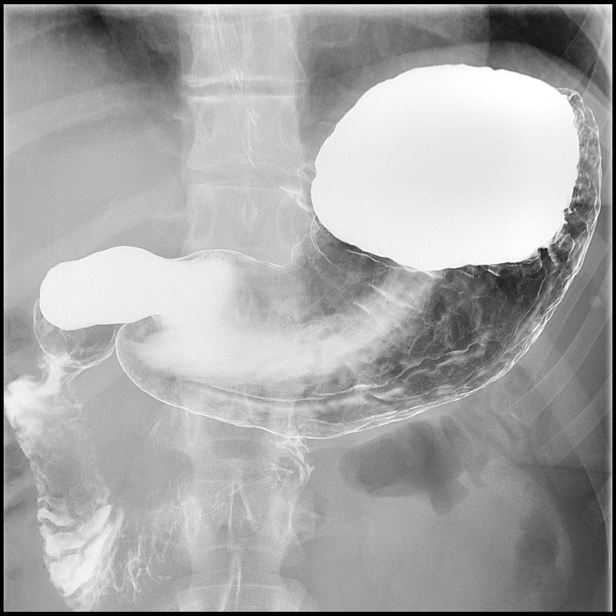

[Series 5: fluoro_barium singleshot_bw · 0.18mm/px · 1 of 1 slices shown (5 of 12)]
[im 1/1]
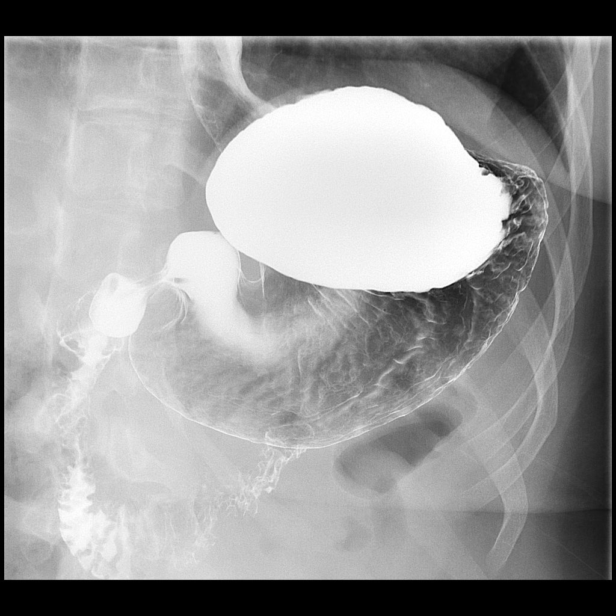

[Series 6: fluoro_barium singleshot_bw · 0.18mm/px · 1 of 1 slices shown (6 of 12)]
[im 1/1]
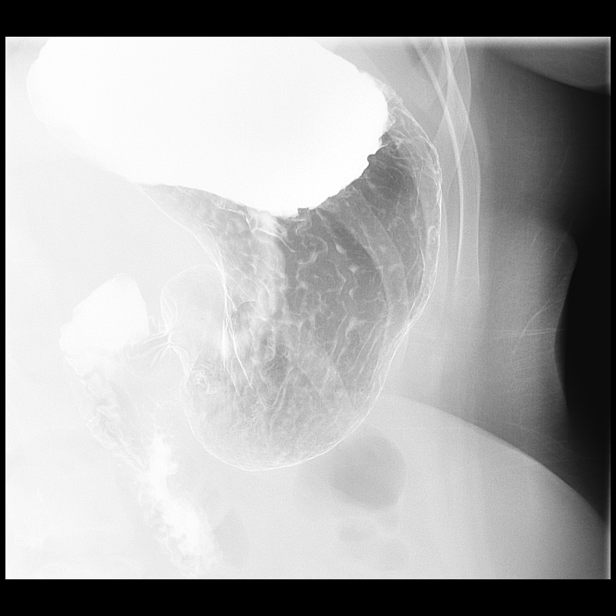

[Series 7: fluoro_barium singleshot_bw · 0.18mm/px · 1 of 1 slices shown (7 of 12)]
[im 1/1]
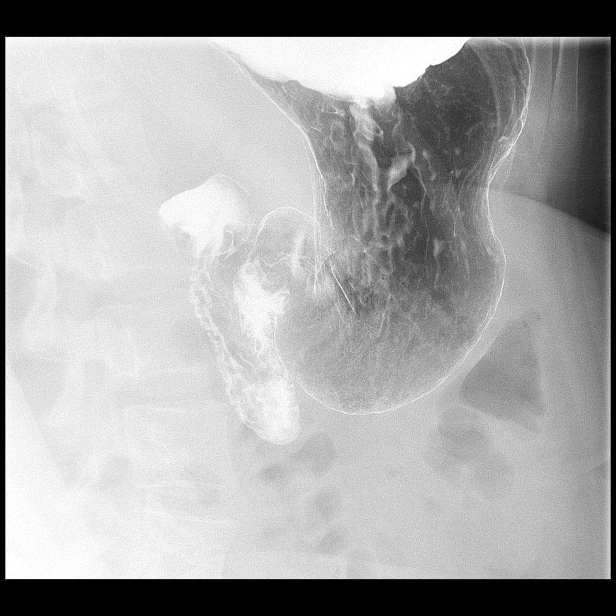

[Series 8: fluoro_barium singleshot_bw · 0.18mm/px · 1 of 1 slices shown (8 of 12)]
[im 1/1]
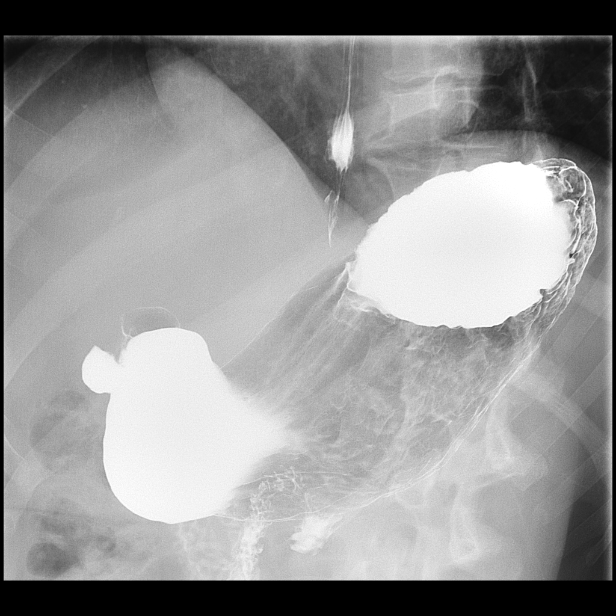

[Series 9: fluoro_barium singleshot_bw · 0.19mm/px · 1 of 1 slices shown (9 of 12)]
[im 1/1]
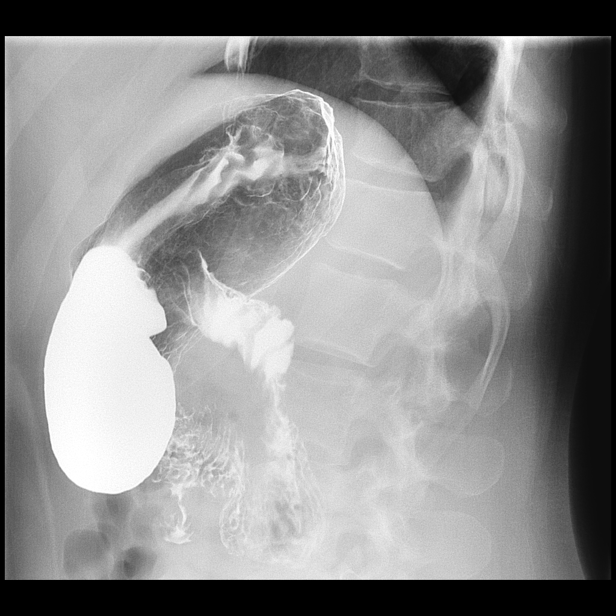

[Series 10: fluoro_barium singleshot_bw · 0.19mm/px · 1 of 1 slices shown (10 of 12)]
[im 1/1]
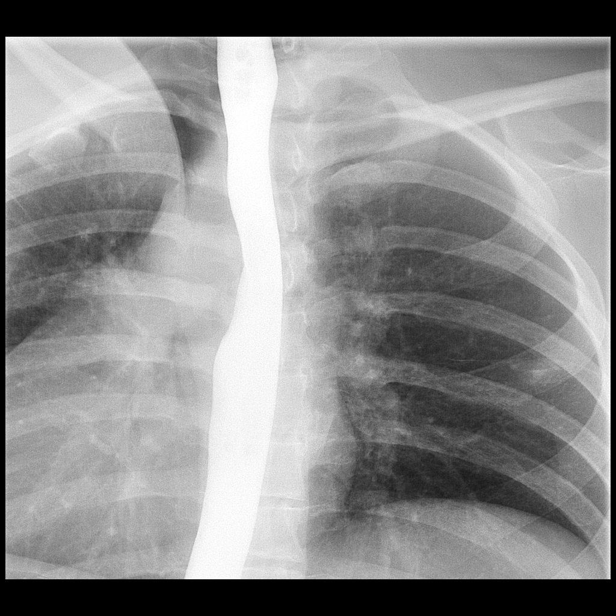

[Series 11: fluoro_barium singleshot_bw · 0.19mm/px · 1 of 1 slices shown (11 of 12)]
[im 1/1]
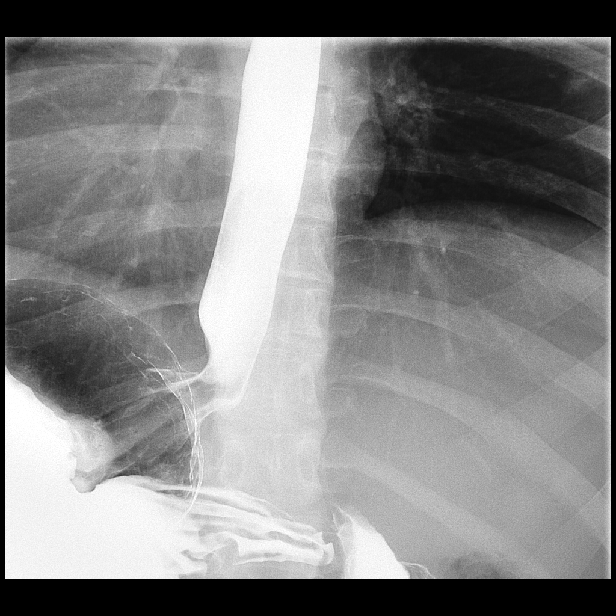

[Series 12: fluoro_barium singleshot_bw · 0.19mm/px · 1 of 1 slices shown (12 of 12)]
[im 1/1]
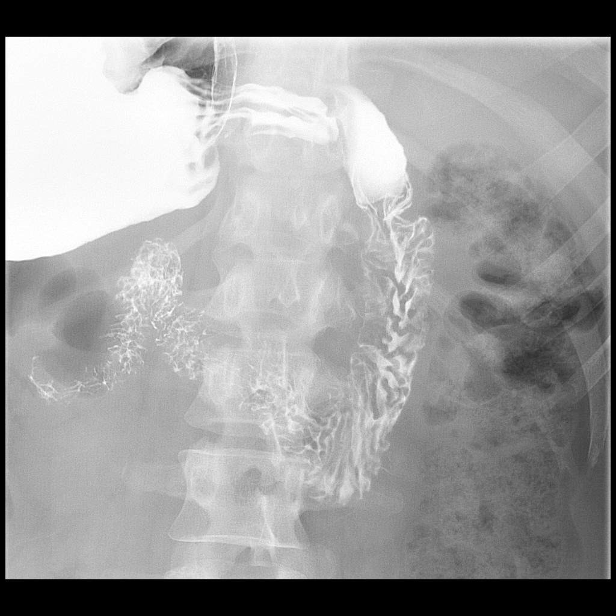

[12 of 12 positions shown; findings below may reference images not displayed]

FINDINGS: Esophagus is widely patent. Normal contour. No focal obstructing
abnormality. Mild gastroesophageal reflux. Stomach appears normal.
No focal gastric lesion or ulceration noted. Duodenal bulb and
C-loop are normal.
IMPRESSION: 1.  Mild gastroesophageal reflux.

2.  Exam is otherwise unremarkable.

## 2021-05-28 ENCOUNTER — Ambulatory Visit: Payer: Managed Care, Other (non HMO) | Admitting: Family Medicine

## 2021-06-08 ENCOUNTER — Encounter: Payer: Managed Care, Other (non HMO) | Admitting: Surgery

## 2021-06-15 ENCOUNTER — Ambulatory Visit (INDEPENDENT_AMBULATORY_CARE_PROVIDER_SITE_OTHER): Payer: BC Managed Care – PPO | Admitting: Surgery

## 2021-06-15 ENCOUNTER — Encounter: Payer: Self-pay | Admitting: Surgery

## 2021-06-15 ENCOUNTER — Other Ambulatory Visit: Payer: Self-pay

## 2021-06-15 VITALS — BP 85/59 | HR 81 | Temp 98.4°F | Ht 67.0 in | Wt 184.0 lb

## 2021-06-15 DIAGNOSIS — L0591 Pilonidal cyst without abscess: Secondary | ICD-10-CM

## 2021-06-15 DIAGNOSIS — Z09 Encounter for follow-up examination after completed treatment for conditions other than malignant neoplasm: Secondary | ICD-10-CM

## 2021-06-15 NOTE — Patient Instructions (Signed)
Continue with the dry top dressing changes daily until the area fully closes.  Follow-up with our office as needed.  Please call and ask to speak with a nurse if you develop questions or concerns.

## 2021-06-15 NOTE — Progress Notes (Signed)
06/15/2021  HPI: Natesha Hassey is a 31 y.o. female s/p pilonidal cyst excision on 04/23/21.  She presents today for follow up.  She was last seen on 05/22/21 as the wound had a partial dehiscence.  She reports today that it's healing well and is almost done.  Reports some mild drainage on the gauze.  Denies any pain.  Vital signs: BP (!) 85/59    Pulse 81    Temp 98.4 F (36.9 C)    Ht 5\' 7"  (1.702 m)    Wt 184 lb (83.5 kg)    LMP 06/01/2021 (Approximate)    SpO2 96%    BMI 28.82 kg/m    Physical Exam: Constitutional:  No acute distress Skin:  Gluteal cleft incision is healing well, very superficial now, measuring about 5 mm in size.  No erythema, induration, or significant drainage.  Assessment/Plan: This is a 31 y.o. female s/p pilonidal cyst excision  --Would is healing well, without any infection.  The wound bed itself is very superficial now and just needs the skin to close over.  Advised to continue dry gauze dressings daily until fully healed. --Follow up prn.   26, MD [facility]

## 2021-06-18 ENCOUNTER — Ambulatory Visit: Payer: BC Managed Care – PPO | Admitting: Family Medicine

## 2021-07-23 ENCOUNTER — Other Ambulatory Visit: Payer: Self-pay

## 2021-07-23 ENCOUNTER — Encounter: Payer: Self-pay | Admitting: Family Medicine

## 2021-07-23 ENCOUNTER — Ambulatory Visit: Payer: BC Managed Care – PPO | Admitting: Family Medicine

## 2021-07-23 VITALS — BP 110/80 | HR 76 | Ht 67.0 in | Wt 181.0 lb

## 2021-07-23 DIAGNOSIS — Z3041 Encounter for surveillance of contraceptive pills: Secondary | ICD-10-CM

## 2021-07-23 DIAGNOSIS — B079 Viral wart, unspecified: Secondary | ICD-10-CM | POA: Diagnosis not present

## 2021-07-23 DIAGNOSIS — J4520 Mild intermittent asthma, uncomplicated: Secondary | ICD-10-CM | POA: Diagnosis not present

## 2021-07-23 MED ORDER — PROAIR HFA 108 (90 BASE) MCG/ACT IN AERS: 2.0000 | INHALATION_SPRAY | RESPIRATORY_TRACT | 6 refills | Status: DC | PRN

## 2021-07-23 MED ORDER — NORTREL 0.5/35 (28) 0.5-35 MG-MCG PO TABS: 1.0000 | ORAL_TABLET | Freq: Every day | ORAL | 5 refills | Status: DC

## 2021-07-23 NOTE — Progress Notes (Signed)
Date:  07/23/2021   Name:  Ann Simmons   DOB:  1990-08-17   MRN:  [id number]   Chief Complaint: Contraception and Asthma  Patient is a 31 year old female who presents for a OCP refill exam. The patient reports the following problems: wart. Health maintenance has been reviewed up to date    Asthma She complains of chest tightness and wheezing. There is no cough, difficulty breathing, frequent throat clearing, hemoptysis, hoarse voice, shortness of breath or sputum production. This is a chronic problem. The current episode started in the past 7 days. The problem occurs intermittently. The problem has been waxing and waning. Pertinent negatives include no chest pain, ear pain, fever, headaches, myalgias or sore throat. Her symptoms are aggravated by change in weather and pollen. Her symptoms are alleviated by beta-agonist. She reports moderate improvement on treatment. Her symptoms are not alleviated by beta-agonist. Her past medical history is significant for asthma.   No results found for: NA, K, CO2, GLUCOSE, BUN, CREATININE, CALCIUM, EGFR, GFRNONAA, GLUCOSE No results found for: CHOL, HDL, LDLCALC, LDLDIRECT, TRIG, CHOLHDL No results found for: TSH No results found for: HGBA1C No results found for: WBC, HGB, HCT, MCV, PLT No results found for: ALT, AST, GGT, ALKPHOS, BILITOT No results found for: 25OHVITD2, 25OHVITD3, VD25OH   Review of Systems  Constitutional:  Negative for chills and fever.  HENT:  Negative for drooling, ear discharge, ear pain, hoarse voice and sore throat.   Respiratory:  Positive for wheezing. Negative for cough, hemoptysis, sputum production and shortness of breath.   Cardiovascular:  Negative for chest pain, palpitations and leg swelling.  Gastrointestinal:  Negative for abdominal pain, blood in stool, constipation, diarrhea and nausea.  Endocrine: Negative for polydipsia.  Genitourinary:  Negative for dysuria, frequency, hematuria and urgency.   Musculoskeletal:  Negative for back pain, myalgias and neck pain.  Skin:  Negative for rash.  Allergic/Immunologic: Negative for environmental allergies.  Neurological:  Negative for dizziness and headaches.  Hematological:  Does not bruise/bleed easily.  Psychiatric/Behavioral:  Negative for suicidal ideas. The patient is not nervous/anxious.    Patient Active Problem List   Diagnosis Date Noted   Infected pilonidal cyst     Allergies  Allergen Reactions   Casein Rash    Past Surgical History:  Procedure Laterality Date   NO PAST SURGERIES N/A    PILONIDAL CYST EXCISION N/A 04/23/2021   Procedure: CYST EXCISION PILONIDAL EXTENSIVE;  Surgeon: Olean Ree, MD;  Location: [facility] ORS;  Service: General;  Laterality: N/A;  provider requesting 1 hour    Social History   Tobacco Use   Smoking status: Never   Smokeless tobacco: Never  Vaping Use   Vaping Use: Never used  Substance Use Topics   Alcohol use: Never   Drug use: Never     Medication list has been reviewed and updated.  Current Meds  Medication Sig   ibuprofen (ADVIL) 800 MG tablet Take 1 tablet (800 mg total) by mouth every 8 (eight) hours as needed for moderate pain.   NORTREL 0.5/35, 28, 0.5-35 MG-MCG tablet Take 1 tablet by mouth at bedtime.   ondansetron (ZOFRAN ODT) 4 MG disintegrating tablet Take 1 tablet (4 mg total) by mouth every 8 (eight) hours as needed for nausea or vomiting.   PROAIR HFA 108 (90 Base) MCG/ACT inhaler Inhale 2 puffs into the lungs every 4 (four) hours as needed for shortness of breath or wheezing.    PHQ 2/9 Scores 07/23/2021  03/31/2021 11/24/2020  PHQ - 2 Score 0 0 0  PHQ- 9 Score 0 0 11    GAD 7 : Generalized Anxiety Score 07/23/2021 03/31/2021 11/24/2020  Nervous, Anxious, on Edge 0 2 1  Control/stop worrying 0 1 1  Worry too much - different things 0 1 1  Trouble relaxing 0 1 1  Restless 0 1 1  Easily annoyed or irritable 0 0 1  Afraid - awful might happen 0 1 3  Total  GAD 7 Score 0 7 9  Anxiety Difficulty Not difficult at all Not difficult at all Somewhat difficult    BP Readings from Last 3 Encounters:  07/23/21 110/80  06/15/21 (!) 85/59  05/22/21 104/68    Physical Exam Vitals and nursing note reviewed.  Constitutional:      Appearance: She is well-developed.  HENT:     Head: Normocephalic.     Right Ear: Tympanic membrane, ear canal and external ear normal.     Left Ear: Tympanic membrane, ear canal and external ear normal.     Nose: Nose normal.     Mouth/Throat:     Mouth: Mucous membranes are moist.  Eyes:     General: Lids are everted, no foreign bodies appreciated. No scleral icterus.       Left eye: No foreign body or hordeolum.     Conjunctiva/sclera: Conjunctivae normal.     Right eye: Right conjunctiva is not injected.     Left eye: Left conjunctiva is not injected.     Pupils: Pupils are equal, round, and reactive to light.  Neck:     Thyroid: No thyromegaly.     Vascular: No JVD.     Trachea: No tracheal deviation.  Cardiovascular:     Rate and Rhythm: Normal rate and regular rhythm.     Heart sounds: Normal heart sounds. No murmur heard.   No friction rub. No gallop.  Pulmonary:     Effort: Pulmonary effort is normal. No respiratory distress.     Breath sounds: Normal breath sounds. No decreased breath sounds, wheezing, rhonchi or rales.  Abdominal:     General: Bowel sounds are normal.     Palpations: Abdomen is soft. There is no mass.     Tenderness: There is no abdominal tenderness. There is no guarding or rebound.  Musculoskeletal:        General: No tenderness. Normal range of motion.     Cervical back: Normal range of motion and neck supple.  Lymphadenopathy:     Cervical: No cervical adenopathy.  Skin:    General: Skin is warm.     Findings: Lesion present.     Comments: Wart left middle/inferior /palmar surface to pip jt/0.5 cm/macular  Neurological:     Mental Status: She is alert.     Cranial Nerves:  No cranial nerve deficit.  Psychiatric:        Mood and Affect: Mood is not anxious or depressed.    Wt Readings from Last 3 Encounters:  07/23/21 181 lb (82.1 kg)  06/15/21 184 lb (83.5 kg)  05/22/21 184 lb 6.4 oz (83.6 kg)    BP 110/80    Pulse 76    Ht '5\' 7"'  (1.702 m)    Wt 181 lb (82.1 kg)    LMP 07/17/2021 (Approximate)    SpO2 96%    BMI 28.35 kg/m   Assessment and Plan:  1. Encounter for surveillance of contraceptive pills Chronic.  Controlled.  Stable.  Patient  has been on Nortrel for several years and is tolerating well and needs refill until she can get in with her gynecologist for maintenance care - NORTREL 0.5/35, 28, 0.5-35 MG-MCG tablet; Take 1 tablet by mouth at bedtime.  Dispense: 28 tablet; Refill: 5  2. Mild intermittent asthma without complication Chronic.  Intermittent.  Mild in nature.  Currently exacerbated by the pollen count.  Continue ProAir 2 puffs each lungs 4 time every 4 to 6 hours. - PROAIR HFA 108 (90 Base) MCG/ACT inhaler; Inhale 2 puffs into the lungs every 4 (four) hours as needed for shortness of breath or wheezing.  Dispense: 6.7 g; Refill: 6  3. Viral warts, unspecified type Chronic.  Persistent.  Stable.  Small 0.5 cm macular wart inferior to the PIP joint on the left middle finger.  Unsuccessful attempts to remove with over-the-counter preparations.  With permission patient allows me to apply cryosurgery with spray ice ball applied and allowed to thaw and repeated x1.  If unresolved patient will call and we will refer to dermatology.

## 2022-01-21 ENCOUNTER — Other Ambulatory Visit: Payer: Self-pay | Admitting: Family Medicine

## 2022-01-21 DIAGNOSIS — Z3041 Encounter for surveillance of contraceptive pills: Secondary | ICD-10-CM

## 2022-01-21 NOTE — Telephone Encounter (Signed)
Requested Prescriptions  Pending Prescriptions Disp Refills  . NORTREL 0.5/35, 28, 0.5-35 MG-MCG tablet [Pharmacy Med Name: NORTREL 0.5-35-28 TABLET] 84 tablet 1    Sig: TAKE 1 TABLET BY MOUTH EVERYDAY AT BEDTIME     OB/GYN:  Contraceptives Passed - 01/21/2022  2:25 AM      Passed - Last BP in normal range    BP Readings from Last 1 Encounters:  07/23/21 110/80         Passed - Valid encounter within last 12 months    Recent Outpatient Visits          6 months ago Encounter for surveillance of contraceptive pills   [place] [facility], [facility]   9 months ago Pilonidal abscess   [place] [facility], MD   1 year ago Establishing care with new doctor, encounter for   [facility] at [facility], [facility]             Passed - Patient is not a smoker

## 2022-07-15 ENCOUNTER — Other Ambulatory Visit: Payer: Self-pay | Admitting: Family Medicine

## 2022-07-15 DIAGNOSIS — Z3041 Encounter for surveillance of contraceptive pills: Secondary | ICD-10-CM

## 2022-07-15 NOTE — Telephone Encounter (Signed)
Requested Prescriptions  Pending Prescriptions Disp Refills   NORTREL 0.5/35, 28, 0.5-35 MG-MCG tablet [Pharmacy Med Name: NORTREL 0.5-35-28 TABLET] 28 tablet 0    Sig: TAKE 1 TABLET BY MOUTH EVERYDAY AT BEDTIME     OB/GYN:  Contraceptives Passed - 07/15/2022  1:35 AM      Passed - Last BP in normal range    BP Readings from Last 1 Encounters:  07/23/21 110/80         Passed - Valid encounter within last 12 months    Recent Outpatient Visits           11 months ago Encounter for surveillance of contraceptive pills   [place] [facility] [place], Deanna C, MD   1 year ago Pilonidal abscess   [place] [facility] [place], Deanna C, MD   1 year ago Establishing care with new doctor, encounter for   [place] [facility] [place], Deanna C, MD              Passed - Patient is not a smoker

## 2022-08-04 ENCOUNTER — Other Ambulatory Visit: Payer: Self-pay | Admitting: Family Medicine

## 2022-08-04 DIAGNOSIS — Z3041 Encounter for surveillance of contraceptive pills: Secondary | ICD-10-CM

## 2022-08-04 NOTE — Telephone Encounter (Signed)
Requested medication (s) are due for refill today: yes  Requested medication (s) are on the active medication list: yes  Last refill:  07/15/22 #28  Future visit scheduled: no  Notes to clinic:  called pt and LM on VM to call back to make appt.    Requested Prescriptions  Pending Prescriptions Disp Refills   NORTREL 0.5/35, 28, 0.5-35 MG-MCG tablet [Pharmacy Med Name: NORTREL 0.5-35-28 TABLET] 84 tablet 1    Sig: TAKE 1 TABLET BY MOUTH EVERYDAY AT BEDTIME     OB/GYN:  Contraceptives Failed - 08/04/2022  8:33 AM      Failed - Valid encounter within last 12 months    Recent Outpatient Visits           1 year ago Encounter for surveillance of contraceptive pills   [place] [facility] [place], Deanna C, MD   1 year ago Pilonidal abscess   [place] [facility] [place], Deanna C, MD   1 year ago Establishing care with new doctor, encounter for   [place] [facility], MD              Passed - Last BP in normal range    BP Readings from Last 1 Encounters:  07/23/21 110/80         Passed - Patient is not a smoker

## 2022-11-09 ENCOUNTER — Other Ambulatory Visit: Payer: Self-pay | Admitting: Family Medicine

## 2022-11-09 DIAGNOSIS — Z3041 Encounter for surveillance of contraceptive pills: Secondary | ICD-10-CM

## 2022-11-15 ENCOUNTER — Other Ambulatory Visit: Payer: Self-pay | Admitting: Family Medicine

## 2022-11-15 DIAGNOSIS — Z3041 Encounter for surveillance of contraceptive pills: Secondary | ICD-10-CM

## 2022-11-16 ENCOUNTER — Other Ambulatory Visit: Payer: Self-pay | Admitting: Family Medicine

## 2022-11-16 DIAGNOSIS — Z3041 Encounter for surveillance of contraceptive pills: Secondary | ICD-10-CM

## 2022-11-16 NOTE — Telephone Encounter (Signed)
Unable to refill per protocol, appointment needed.   Requested Prescriptions  Pending Prescriptions Disp Refills   NORTREL 0.5/35, 28, 0.5-35 MG-MCG tablet [Pharmacy Med Name: NORTREL 0.5-35-28 TABLET] 28 tablet 2    Sig: TAKE 1 TABLET BY MOUTH EVERY DAY     OB/GYN:  Contraceptives Failed - 11/15/2022  2:44 PM      Failed - Valid encounter within last 12 months    Recent Outpatient Visits           1 year ago Encounter for surveillance of contraceptive pills   [place] [facility], MD   1 year ago Pilonidal abscess   [place] [facility], MD   1 year ago Establishing care with new doctor, encounter for   [facility], MD              Passed - Last BP in normal range    BP Readings from Last 1 Encounters:  07/23/21 110/80         Passed - Patient is not a smoker

## 2022-11-16 NOTE — Telephone Encounter (Unsigned)
Copied from CRM [id number]. Topic: General - Other >> Nov 16, 2022  3:19 PM Everette C wrote: Reason for CRM: Medication Refill - Medication: norethindrone-ethinyl estradiol (NORTREL 0.5/35, 28,) 0.5-35 MG-MCG tablet [[id number]]  Has the patient contacted their pharmacy? Yes.   (Agent: If no, request that the patient contact the pharmacy for the refill. If patient does not wish to contact the pharmacy document the reason why and proceed with request.) (Agent: If yes, when and what did the pharmacy advise?)  Preferred Pharmacy (with phone number or street name): [facility] [phone] [place], [place] - [address] [place] [address] Phone: [phone] Fax: [phone] Hours: Not open 24 hours   Has the patient been seen for an appointment in the last year OR does the patient have an upcoming appointment? Yes.    Agent: Please be advised that RX refills may take up to 3 business days. We ask that you follow-up with your pharmacy.

## 2022-11-17 NOTE — Telephone Encounter (Signed)
Requested medication (s) are due for refill today: yes  Requested medication (s) are on the active medication list: yes  Last refill:  08/05/22  Future visit scheduled: yes  Notes to clinic:  Unable to refill per protocol, appointment needed.  Medication was refused due to patient needing OV. OV scheduled, routing for approval.     Requested Prescriptions  Pending Prescriptions Disp Refills   norethindrone-ethinyl estradiol (NORTREL 0.5/35, 28,) 0.5-35 MG-MCG tablet 84 tablet 0    Sig: Take 1 tablet by mouth daily.     OB/GYN:  Contraceptives Failed - 11/16/2022  5:38 PM      Failed - Valid encounter within last 12 months    Recent Outpatient Visits           1 year ago Encounter for surveillance of contraceptive pills   [place] [facility], MD   1 year ago Pilonidal abscess   [place] [facility], MD   1 year ago Establishing care with new doctor, encounter for   [facility], MD       Future Appointments             In 1 week Duanne Limerick, MD [facility] at [facility], [facility]            Passed - Last BP in normal range    BP Readings from Last 1 Encounters:  07/23/21 110/80         Passed - Patient is not a smoker

## 2022-11-17 NOTE — Telephone Encounter (Signed)
Patient has an appointment on 6/20 for med refills.

## 2022-11-25 ENCOUNTER — Encounter: Payer: Self-pay | Admitting: Family Medicine

## 2022-11-25 ENCOUNTER — Ambulatory Visit: Payer: BC Managed Care – PPO | Admitting: Family Medicine

## 2022-11-25 ENCOUNTER — Other Ambulatory Visit: Payer: Self-pay | Admitting: Family Medicine

## 2022-11-25 VITALS — BP 126/78 | HR 81 | Ht 67.0 in | Wt 180.0 lb

## 2022-11-25 DIAGNOSIS — J4520 Mild intermittent asthma, uncomplicated: Secondary | ICD-10-CM | POA: Diagnosis not present

## 2022-11-25 DIAGNOSIS — R1319 Other dysphagia: Secondary | ICD-10-CM

## 2022-11-25 DIAGNOSIS — R1011 Right upper quadrant pain: Secondary | ICD-10-CM | POA: Diagnosis not present

## 2022-11-25 DIAGNOSIS — Z3041 Encounter for surveillance of contraceptive pills: Secondary | ICD-10-CM | POA: Diagnosis not present

## 2022-11-25 MED ORDER — PROAIR HFA 108 (90 BASE) MCG/ACT IN AERS: 2.0000 | INHALATION_SPRAY | RESPIRATORY_TRACT | 6 refills | Status: DC | PRN

## 2022-11-25 MED ORDER — PANTOPRAZOLE SODIUM 40 MG PO TBEC
40.0000 mg | DELAYED_RELEASE_TABLET | Freq: Every day | ORAL | 3 refills | Status: AC
Start: 2022-11-25 — End: ?

## 2022-11-25 MED ORDER — NORTREL 0.5/35 (28) 0.5-35 MG-MCG PO TABS: 1.0000 | ORAL_TABLET | Freq: Every day | ORAL | 0 refills | Status: AC

## 2022-11-25 NOTE — Progress Notes (Addendum)
Date:  11/25/2022   Name:  Ann Simmons   DOB:  1991-01-06   MRN:  [id number]   Chief Complaint: Contraception, Asthma, and Irritable Bowel Syndrome ([place]- cannot eat without being in pain, having GERD at night when laying down. Taking ibuprofen and TUMS)  Asthma She complains of wheezing. There is no chest tightness, cough, difficulty breathing, frequent throat clearing, shortness of breath or sputum production. This is a chronic problem. The problem occurs intermittently. The problem has been gradually improving. Associated symptoms include trouble swallowing. Pertinent negatives include no appetite change, chest pain, dyspnea on exertion, ear congestion, fever, PND, postnasal drip, sore throat or sweats. Her past medical history is significant for asthma.  Abdominal Pain This is a recurrent problem. The current episode started more than 1 year ago. The onset quality is sudden. The problem has been waxing and waning. The pain is located in the RUQ and epigastric region. The pain is moderate. The quality of the pain is burning and colicky. The abdominal pain radiates to the back. Associated symptoms include belching and nausea. Pertinent negatives include no constipation, diarrhea, fever, flatus, frequency, hematochezia, hematuria, melena or vomiting. Associated symptoms comments: Pale tan bm/passage liquids with difficulty. The pain is aggravated by NSAIDs (pt advised to stop NSAIDs). She has tried antacids for the symptoms. The treatment provided moderate relief.    No results found for: "NA", "K", "CO2", "GLUCOSE", "BUN", "CREATININE", "CALCIUM", "EGFR", "GFRNONAA" No results found for: "CHOL", "HDL", "LDLCALC", "LDLDIRECT", "TRIG", "CHOLHDL" No results found for: "TSH" No results found for: "HGBA1C" No results found for: "WBC", "HGB", "HCT", "MCV", "PLT" No results found for: "ALT", "AST", "GGT", "ALKPHOS", "BILITOT" No results found for: "25OHVITD2", "25OHVITD3", "VD25OH"   Review of  Systems  Constitutional:  Negative for appetite change and fever.  HENT:  Positive for trouble swallowing. Negative for postnasal drip and sore throat.   Respiratory:  Positive for wheezing. Negative for cough, sputum production, chest tightness and shortness of breath.   Cardiovascular:  Negative for chest pain, dyspnea on exertion, palpitations and PND.  Gastrointestinal:  Positive for abdominal pain and nausea. Negative for abdominal distention, anal bleeding, blood in stool, constipation, diarrhea, flatus, hematochezia, melena, rectal pain and vomiting.  Genitourinary:  Negative for frequency and hematuria.    Patient Active Problem List   Diagnosis Date Noted   Infected pilonidal cyst     Allergies  Allergen Reactions   Casein Rash    Past Surgical History:  Procedure Laterality Date   NO PAST SURGERIES N/A    PILONIDAL CYST EXCISION N/A 04/23/2021   Procedure: CYST EXCISION PILONIDAL EXTENSIVE;  Surgeon: Henrene Dodge, MD;  Location: [facility] ORS;  Service: General;  Laterality: N/A;  provider requesting 1 hour    Social History   Tobacco Use   Smoking status: Never   Smokeless tobacco: Never  Vaping Use   Vaping Use: Never used  Substance Use Topics   Alcohol use: Never   Drug use: Never     Medication list has been reviewed and updated.  Current Meds  Medication Sig   ibuprofen (ADVIL) 800 MG tablet Take 1 tablet (800 mg total) by mouth every 8 (eight) hours as needed for moderate pain.   norethindrone-ethinyl estradiol (NORTREL 0.5/35, 28,) 0.5-35 MG-MCG tablet Take 1 tablet by mouth daily.   ondansetron (ZOFRAN ODT) 4 MG disintegrating tablet Take 1 tablet (4 mg total) by mouth every 8 (eight) hours as needed for nausea or vomiting.   PROAIR HFA 108 (  90 Base) MCG/ACT inhaler Inhale 2 puffs into the lungs every 4 (four) hours as needed for shortness of breath or wheezing.       11/25/2022   10:05 AM 07/23/2021    8:15 AM 03/31/2021    8:24 AM 11/24/2020    3:45  PM  GAD 7 : Generalized Anxiety Score  Nervous, Anxious, on Edge 1 0 2 1  Control/stop worrying 1 0 1 1  Worry too much - different things 1 0 1 1  Trouble relaxing 0 0 1 1  Restless 0 0 1 1  Easily annoyed or irritable 0 0 0 1  Afraid - awful might happen 0 0 1 3  Total GAD 7 Score 3 0 7 9  Anxiety Difficulty Not difficult at all Not difficult at all Not difficult at all Somewhat difficult       11/25/2022   10:05 AM 07/23/2021    8:15 AM 03/31/2021    8:24 AM  Depression screen PHQ 2/9  Decreased Interest 0 0 0  Down, Depressed, Hopeless 0 0 0  PHQ - 2 Score 0 0 0  Altered sleeping 0 0 0  Tired, decreased energy 0 0 0  Change in appetite 2 0 0  Feeling bad or failure about yourself  0 0 0  Trouble concentrating 0 0 0  Moving slowly or fidgety/restless 0 0 0  Suicidal thoughts 0 0 0  PHQ-9 Score 2 0 0  Difficult doing work/chores Not difficult at all Not difficult at all     BP Readings from Last 3 Encounters:  11/25/22 126/78  07/23/21 110/80  06/15/21 (!) 85/59    Physical Exam Vitals and nursing note reviewed. Exam conducted with a chaperone present.  Constitutional:      General: She is not in acute distress.    Appearance: She is not diaphoretic.  HENT:     Head: Normocephalic and atraumatic.     Right Ear: Tympanic membrane and external ear normal.     Left Ear: Tympanic membrane and external ear normal.     Nose: Nose normal.     Mouth/Throat:     Mouth: Mucous membranes are moist.  Eyes:     General:        Right eye: No discharge.        Left eye: No discharge.     Conjunctiva/sclera: Conjunctivae normal.     Pupils: Pupils are equal, round, and reactive to light.  Neck:     Thyroid: No thyromegaly.     Vascular: No JVD.  Cardiovascular:     Rate and Rhythm: Normal rate and regular rhythm.     Heart sounds: Normal heart sounds. No murmur heard.    No friction rub. No gallop.  Pulmonary:     Effort: Pulmonary effort is normal.     Breath  sounds: Normal breath sounds. No wheezing, rhonchi or rales.  Abdominal:     General: Bowel sounds are normal.     Palpations: Abdomen is soft. There is no mass.     Tenderness: There is no abdominal tenderness. There is no guarding.  Musculoskeletal:        General: Normal range of motion.     Cervical back: Normal range of motion and neck supple.  Lymphadenopathy:     Cervical: No cervical adenopathy.  Skin:    General: Skin is warm and dry.     Wt Readings from Last 3 Encounters:  11/25/22 180 lb (81.6  kg)  07/23/21 181 lb (82.1 kg)  06/15/21 184 lb (83.5 kg)    BP 126/78   Pulse 81   Ht 5\' 7"  (1.702 m)   Wt 180 lb (81.6 kg)   LMP 10/26/2022 (Approximate)   SpO2 97%   BMI 28.19 kg/m   Assessment and Plan:  1. RUQ abdominal pain New onset.  Began over the week with pain in the middle of the gastric area that is associated with swallowing and that food seems to be coming to a sensation at the mid epigastric prior to entering gastric passage.  Patient is also noted light-colored bowel movements recently.  Patient also says that she is having issues with heartburn.  We will obtain hepatic panel with CBC and lipase.  Initiate pantoprazole 40 mg once a day.  And initiate referral for gastroenterology.  Pending lab work and whether we can have GI to evaluate will depend on whether we consider ultrasound for diagnostic purposes. - Hepatic Function Panel (6) - CBC with Differential/Platelet - Lipase - pantoprazole (PROTONIX) 40 MG tablet; Take 1 tablet (40 mg total) by mouth daily.  Dispense: 30 tablet; Refill: 3 - Ambulatory referral to Gastroenterology  2. Esophageal dysphagia New onset.  Continuous but waxes and wanes in intensity.  Will refer to gastroenterology for evaluation. - Ambulatory referral to Gastroenterology  3. Encounter for surveillance of contraceptive pills We will refill patient's oral contraceptive pills until she can get in with gynecology for maintenance  and continuance of OCP. - norethindrone-ethinyl estradiol (NORTREL 0.5/35, 28,) 0.5-35 MG-MCG tablet; Take 1 tablet by mouth daily.  Dispense: 84 tablet; Refill: 0 - Ambulatory referral to Gynecology  4. Mild intermittent asthma without complication Chronic.  Intermittent.  Mild.  Will refill ProAir to be used on as necessary basis. - PROAIR HFA 108 (90 Base) MCG/ACT inhaler; Inhale 2 puffs into the lungs every 4 (four) hours as needed for shortness of breath or wheezing.  Dispense: 6.7 g; Refill: 6    Elizabeth Sauer, MD

## 2022-11-26 LAB — CBC WITH DIFFERENTIAL/PLATELET
Basophils Absolute: 0 10*3/uL (ref 0.0–0.2)
Basos: 1 %
EOS (ABSOLUTE): 0.1 10*3/uL (ref 0.0–0.4)
Eos: 1 %
Hematocrit: 44.9 % (ref 34.0–46.6)
Hemoglobin: 14.9 g/dL (ref 11.1–15.9)
Immature Grans (Abs): 0 10*3/uL (ref 0.0–0.1)
Immature Granulocytes: 0 %
Lymphocytes Absolute: 2 10*3/uL (ref 0.7–3.1)
Lymphs: 34 %
MCH: 28.1 pg (ref 26.6–33.0)
MCHC: 33.2 g/dL (ref 31.5–35.7)
MCV: 85 fL (ref 79–97)
Monocytes Absolute: 0.4 10*3/uL (ref 0.1–0.9)
Monocytes: 7 %
Neutrophils Absolute: 3.4 10*3/uL (ref 1.4–7.0)
Neutrophils: 57 %
Platelets: 158 10*3/uL (ref 150–450)
RBC: 5.31 x10E6/uL — ABNORMAL HIGH (ref 3.77–5.28)
RDW: 12.1 % (ref 11.7–15.4)
WBC: 5.9 10*3/uL (ref 3.4–10.8)

## 2022-11-26 LAB — LIPASE: Lipase: 21 U/L (ref 14–72)

## 2022-11-26 LAB — HEPATIC FUNCTION PANEL (6)
ALT: 16 IU/L (ref 0–32)
AST: 16 IU/L (ref 0–40)
Albumin: 4.5 g/dL (ref 3.9–4.9)
Alkaline Phosphatase: 71 IU/L (ref 44–121)
Bilirubin Total: 0.2 mg/dL (ref 0.0–1.2)
Bilirubin, Direct: 0.1 mg/dL (ref 0.00–0.40)

## 2022-12-01 DIAGNOSIS — R1013 Epigastric pain: Secondary | ICD-10-CM | POA: Diagnosis not present

## 2022-12-01 DIAGNOSIS — R1319 Other dysphagia: Secondary | ICD-10-CM | POA: Diagnosis not present

## 2022-12-01 DIAGNOSIS — K5909 Other constipation: Secondary | ICD-10-CM | POA: Diagnosis not present

## 2022-12-30 ENCOUNTER — Ambulatory Visit: Payer: BC Managed Care – PPO

## 2022-12-30 DIAGNOSIS — K2950 Unspecified chronic gastritis without bleeding: Secondary | ICD-10-CM | POA: Diagnosis not present

## 2023-01-04 DIAGNOSIS — Z3041 Encounter for surveillance of contraceptive pills: Secondary | ICD-10-CM | POA: Diagnosis not present

## 2023-01-04 DIAGNOSIS — Z1331 Encounter for screening for depression: Secondary | ICD-10-CM | POA: Diagnosis not present

## 2023-01-04 DIAGNOSIS — Z01411 Encounter for gynecological examination (general) (routine) with abnormal findings: Secondary | ICD-10-CM | POA: Diagnosis not present
